# Patient Record
Sex: Male | Born: 1950 | Hispanic: Yes | Smoking: Former smoker
Health system: Southern US, Community
[De-identification: ages and names within clinical notes are randomized; demographics above are authoritative.]

## PROBLEM LIST (undated history)

## (undated) DIAGNOSIS — E785 Hyperlipidemia, unspecified: Secondary | ICD-10-CM

## (undated) DIAGNOSIS — I639 Cerebral infarction, unspecified: Secondary | ICD-10-CM

## (undated) DIAGNOSIS — F0390 Unspecified dementia without behavioral disturbance: Secondary | ICD-10-CM

## (undated) DIAGNOSIS — I6529 Occlusion and stenosis of unspecified carotid artery: Secondary | ICD-10-CM

## (undated) DIAGNOSIS — I10 Essential (primary) hypertension: Secondary | ICD-10-CM

## (undated) HISTORY — DX: Cerebral infarction, unspecified: I63.9

## (undated) HISTORY — DX: Essential (primary) hypertension: I10

## (undated) HISTORY — DX: Hyperlipidemia, unspecified: E78.5

## (undated) HISTORY — DX: Occlusion and stenosis of unspecified carotid artery: I65.29

## (undated) HISTORY — DX: Unspecified dementia, unspecified severity, without behavioral disturbance, psychotic disturbance, mood disturbance, and anxiety: F03.90

---

## 2011-12-18 ENCOUNTER — Inpatient Hospital Stay: Payer: Self-pay | Admitting: Internal Medicine

## 2011-12-18 LAB — URINALYSIS, COMPLETE
Bacteria: NONE SEEN
Bilirubin,UR: NEGATIVE
Glucose,UR: NEGATIVE mg/dL (ref 0–75)
Nitrite: NEGATIVE
Ph: 5 (ref 4.5–8.0)
Protein: NEGATIVE
RBC,UR: 1 /HPF (ref 0–5)
Squamous Epithelial: NONE SEEN
WBC UR: <1 /HPF (ref 0–5)

## 2011-12-18 LAB — CBC
HGB: 17 g/dL (ref 13.0–18.0)
MCV: 94 fL (ref 80–100)
Platelet: 250 10*3/uL (ref 150–440)
RBC: 5.23 10*6/uL (ref 4.40–5.90)
RDW: 13 % (ref 11.5–14.5)
WBC: 10.5 10*3/uL (ref 3.8–10.6)

## 2011-12-18 LAB — BASIC METABOLIC PANEL
Anion Gap: 8 (ref 7–16)
BUN: 11 mg/dL (ref 7–18)
Co2: 29 mmol/L (ref 21–32)
Creatinine: 0.62 mg/dL (ref 0.60–1.30)
EGFR (African American): >60
EGFR (Non-African Amer.): >60
Glucose: 94 mg/dL (ref 65–99)
Osmolality: 277 (ref 275–301)
Sodium: 139 mmol/L (ref 136–145)

## 2011-12-19 DIAGNOSIS — I059 Rheumatic mitral valve disease, unspecified: Secondary | ICD-10-CM

## 2011-12-19 LAB — BASIC METABOLIC PANEL
Calcium, Total: 9 mg/dL (ref 8.5–10.1)
Chloride: 104 mmol/L (ref 98–107)
Co2: 27 mmol/L (ref 21–32)
EGFR (Non-African Amer.): >60
Glucose: 99 mg/dL (ref 65–99)
Osmolality: 280 (ref 275–301)
Potassium: 3.5 mmol/L (ref 3.5–5.1)

## 2011-12-19 LAB — CBC WITH DIFFERENTIAL/PLATELET
Eosinophil: 22 %
HCT: 47.1 % (ref 40.0–52.0)
HGB: 16.4 g/dL (ref 13.0–18.0)
Lymphocytes: 31 %
Monocytes: 7 %
RBC: 5.01 10*6/uL (ref 4.40–5.90)
RDW: 13.3 % (ref 11.5–14.5)
WBC: 11.8 10*3/uL — ABNORMAL HIGH (ref 3.8–10.6)

## 2011-12-19 LAB — LIPID PANEL
Cholesterol: 237 mg/dL — ABNORMAL HIGH (ref 0–200)
Triglycerides: 290 mg/dL — ABNORMAL HIGH (ref 0–200)
VLDL Cholesterol, Calc: 58 mg/dL — ABNORMAL HIGH (ref 5–40)

## 2011-12-19 LAB — TSH: Thyroid Stimulating Horm: 7.1 u[IU]/mL — ABNORMAL HIGH

## 2011-12-19 LAB — T4, FREE: Free Thyroxine: 1.06 ng/dL (ref 0.76–1.46)

## 2011-12-19 LAB — HEMOGLOBIN A1C: Hemoglobin A1C: 6.2 % (ref 4.2–6.3)

## 2011-12-20 LAB — TSH: Thyroid Stimulating Horm: 3.77 u[IU]/mL

## 2011-12-26 ENCOUNTER — Ambulatory Visit: Payer: Self-pay | Admitting: Vascular Surgery

## 2011-12-26 LAB — CBC
HCT: 46.4 % (ref 40.0–52.0)
HGB: 16.4 g/dL (ref 13.0–18.0)
MCH: 33.1 pg (ref 26.0–34.0)
MCHC: 35.2 g/dL (ref 32.0–36.0)
WBC: 9 10*3/uL (ref 3.8–10.6)

## 2011-12-26 LAB — BASIC METABOLIC PANEL
BUN: 16 mg/dL (ref 7–18)
Calcium, Total: 9.4 mg/dL (ref 8.5–10.1)
Creatinine: 0.62 mg/dL (ref 0.60–1.30)
EGFR (African American): >60
EGFR (Non-African Amer.): >60
Glucose: 97 mg/dL (ref 65–99)
Potassium: 4.6 mmol/L (ref 3.5–5.1)
Sodium: 137 mmol/L (ref 136–145)

## 2012-01-02 ENCOUNTER — Inpatient Hospital Stay: Payer: Self-pay | Admitting: Vascular Surgery

## 2012-01-02 HISTORY — PX: CAROTID ENDARTERECTOMY: SUR193

## 2012-01-03 LAB — BASIC METABOLIC PANEL
Anion Gap: 7 (ref 7–16)
Calcium, Total: 8.5 mg/dL (ref 8.5–10.1)
EGFR (African American): >60
EGFR (Non-African Amer.): >60
Glucose: 114 mg/dL — ABNORMAL HIGH (ref 65–99)
Osmolality: 276 (ref 275–301)
Potassium: 3.6 mmol/L (ref 3.5–5.1)
Sodium: 139 mmol/L (ref 136–145)

## 2012-01-03 LAB — CBC WITH DIFFERENTIAL/PLATELET
Basophil #: 0.1 10*3/uL (ref 0.0–0.1)
Basophil %: 0.5 %
HCT: 39.8 % — ABNORMAL LOW (ref 40.0–52.0)
HGB: 13.7 g/dL (ref 13.0–18.0)
Lymphocyte %: 14.9 %
Monocyte %: 4.6 %
Neutrophil #: 8.9 10*3/uL — ABNORMAL HIGH (ref 1.4–6.5)
Platelet: 200 10*3/uL (ref 150–440)
RDW: 12.8 % (ref 11.5–14.5)
WBC: 12 10*3/uL — ABNORMAL HIGH (ref 3.8–10.6)

## 2012-01-03 LAB — PROTIME-INR: Prothrombin Time: 13.7 secs (ref 11.5–14.7)

## 2012-01-03 LAB — APTT: Activated PTT: 29.2 secs (ref 23.6–35.9)

## 2012-04-07 ENCOUNTER — Ambulatory Visit: Payer: Self-pay | Admitting: Neurology

## 2012-04-07 LAB — PROTIME-INR
INR: 1
Prothrombin Time: 13.9 secs (ref 11.5–14.7)

## 2012-04-07 LAB — APTT: Activated PTT: 30.3 secs (ref 23.6–35.9)

## 2012-04-07 LAB — PLATELET COUNT: Platelet: 242 10*3/uL (ref 150–440)

## 2012-04-09 ENCOUNTER — Ambulatory Visit: Payer: Self-pay | Admitting: Neurology

## 2012-04-10 ENCOUNTER — Ambulatory Visit: Payer: Self-pay | Admitting: Neurology

## 2013-11-02 DIAGNOSIS — Z125 Encounter for screening for malignant neoplasm of prostate: Secondary | ICD-10-CM | POA: Diagnosis not present

## 2013-11-02 DIAGNOSIS — E782 Mixed hyperlipidemia: Secondary | ICD-10-CM | POA: Diagnosis not present

## 2013-11-02 DIAGNOSIS — R7301 Impaired fasting glucose: Secondary | ICD-10-CM | POA: Diagnosis not present

## 2013-11-02 DIAGNOSIS — I63239 Cerebral infarction due to unspecified occlusion or stenosis of unspecified carotid arteries: Secondary | ICD-10-CM | POA: Diagnosis not present

## 2013-11-02 DIAGNOSIS — K469 Unspecified abdominal hernia without obstruction or gangrene: Secondary | ICD-10-CM | POA: Diagnosis not present

## 2013-11-02 DIAGNOSIS — I10 Essential (primary) hypertension: Secondary | ICD-10-CM | POA: Diagnosis not present

## 2013-11-02 DIAGNOSIS — R300 Dysuria: Secondary | ICD-10-CM | POA: Diagnosis not present

## 2013-11-02 DIAGNOSIS — I69959 Hemiplegia and hemiparesis following unspecified cerebrovascular disease affecting unspecified side: Secondary | ICD-10-CM | POA: Diagnosis not present

## 2014-05-03 DIAGNOSIS — E782 Mixed hyperlipidemia: Secondary | ICD-10-CM | POA: Diagnosis not present

## 2014-05-03 DIAGNOSIS — Z23 Encounter for immunization: Secondary | ICD-10-CM | POA: Diagnosis not present

## 2014-05-03 DIAGNOSIS — I6523 Occlusion and stenosis of bilateral carotid arteries: Secondary | ICD-10-CM | POA: Diagnosis not present

## 2014-05-03 DIAGNOSIS — I10 Essential (primary) hypertension: Secondary | ICD-10-CM | POA: Diagnosis not present

## 2014-05-03 DIAGNOSIS — B359 Dermatophytosis, unspecified: Secondary | ICD-10-CM | POA: Diagnosis not present

## 2014-05-03 DIAGNOSIS — R945 Abnormal results of liver function studies: Secondary | ICD-10-CM | POA: Diagnosis not present

## 2014-06-01 DIAGNOSIS — Z1211 Encounter for screening for malignant neoplasm of colon: Secondary | ICD-10-CM | POA: Diagnosis not present

## 2014-06-01 DIAGNOSIS — Z79899 Other long term (current) drug therapy: Secondary | ICD-10-CM | POA: Diagnosis not present

## 2014-06-21 DIAGNOSIS — E785 Hyperlipidemia, unspecified: Secondary | ICD-10-CM | POA: Diagnosis not present

## 2014-06-21 DIAGNOSIS — I6529 Occlusion and stenosis of unspecified carotid artery: Secondary | ICD-10-CM | POA: Diagnosis not present

## 2014-06-21 DIAGNOSIS — I639 Cerebral infarction, unspecified: Secondary | ICD-10-CM | POA: Diagnosis not present

## 2014-06-21 DIAGNOSIS — I63239 Cerebral infarction due to unspecified occlusion or stenosis of unspecified carotid arteries: Secondary | ICD-10-CM | POA: Diagnosis not present

## 2014-07-19 ENCOUNTER — Ambulatory Visit: Payer: Self-pay | Admitting: Gastroenterology

## 2014-07-19 DIAGNOSIS — Z1211 Encounter for screening for malignant neoplasm of colon: Secondary | ICD-10-CM | POA: Diagnosis not present

## 2014-07-19 DIAGNOSIS — K5730 Diverticulosis of large intestine without perforation or abscess without bleeding: Secondary | ICD-10-CM | POA: Diagnosis not present

## 2014-07-19 DIAGNOSIS — D122 Benign neoplasm of ascending colon: Secondary | ICD-10-CM | POA: Diagnosis not present

## 2014-07-19 DIAGNOSIS — K635 Polyp of colon: Secondary | ICD-10-CM | POA: Diagnosis not present

## 2014-07-19 DIAGNOSIS — Z7982 Long term (current) use of aspirin: Secondary | ICD-10-CM | POA: Diagnosis not present

## 2014-07-19 DIAGNOSIS — I10 Essential (primary) hypertension: Secondary | ICD-10-CM | POA: Diagnosis not present

## 2014-07-19 DIAGNOSIS — Z8673 Personal history of transient ischemic attack (TIA), and cerebral infarction without residual deficits: Secondary | ICD-10-CM | POA: Diagnosis not present

## 2014-09-06 NOTE — H&P (Signed)
PATIENT NAME:  Carlos Fischer, Carlos Fischer MR#:  [id number] DATE OF BIRTH:  [birth year]  DATE OF ADMISSION:  12/18/2011  PRIMARY CARE PHYSICIAN: [facility]. ED REFERRING PHYSICIAN: Dr. Robet Leu.   CHIEF COMPLAINT: Difficulty with vision. 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   HISTORY OF PRESENT ILLNESS: The patient is a 64 year old Spanish male Spanish speaking, had to use a translator to communicate with him, who presented to the ED after at work. He usually packs boxes and was noted to have problems with three boxes that were done improperly. The patient's initial was "something coming over my vision". He was brought to the Emergency Department. He actually was evaluated with initially CT scan of the head. Therefore underwent a MRI of the brain which confirmed a finding of acute nonhemorrhagic right frontal lobe infarct as well as multiple acute punctate infarcts involving the right frontal and parietal lobe suggesting a possible embolic phenomenon. Also, there was a small area of tiny foci of hemorrhage in the inferior right parietal lobe. We were asked to admit the patient. He currently states that his vision is back to normal. He could not describe what had happened. He stated that his eyes were not blurred. He did not have any double vision. It is just that when he was working he messed up a few boxes. Therefore, he was sent here. He does not complain of any other  weakness in his arms or legs. No difficulty with swallowing. No speech troubles. His denies any chest pains or palpitations.   PAST MEDICAL HISTORY: Significant for hypertension and hyperlipidemia which he reports has been diagnosed about a year ago and is untreated. He is not on any medications.   PAST SURGICAL HISTORY: None.   ALLERGIES: None.   MEDICATIONS: None.   SOCIAL HISTORY: Does not smoke. Drinks socially. No drug use.   FAMILY HISTORY: Father with prostate problem. Mother had some sort of  cancer. Denies any history of stroke or coronary artery disease in the family.   REVIEW OF SYSTEMS: CONSTITUTIONAL: Denies any fevers or chills. No weight loss. No weight gain. HEENT: Denies any pain in the eye. No redness. No double vision. Nose: Denies any nasal drainage or ulceration. No difficulty with swallowing. CARDIOVASCULAR: Denies any chest pain. No shortness of breath. No dyspnea on exertion. No palpitations. No syncope. PULMONARY: Denies any chronic obstructive pulmonary disease. No pneumonia. No tuberculosis. No asthma. No wheezing. GASTROINTESTINAL: Denies any nausea, vomiting, or diarrhea. No abdominal pain. No changes in bowel habits. No hematochezia. No hematemesis.  GU: Denies any dysuria, hematuria, renal calculus or frequency. SKIN: Denies any skin rash. HEME: Denies any anemia or easy bruisability. MUSCULOSKELETAL: Denies any pain in his joints or swelling. PSYCHIATRIC: Denies any anxiety or depression. NEUROLOGICAL: As above. He denies any asymmetrical weakness. No history of cerebrovascular accident. No seizures. No transient ischemic attacks in the past.   PHYSICAL EXAMINATION:  VITAL SIGNS: Temperature 97.8, pulse 71, respirations 20, blood pressure 145/87 and then most recent was 185/98.   GENERAL: The patient is a well-developed Spanish male not in any acute distress.   HEENT: Head atraumatic, normocephalic. Pupils equally round, reactive to light and accommodation. There is no conjunctival pallor. No scleral icterus. Extraocular movements intact. Nasal exam shows no drainage or ulceration. Oropharynx is clear.   NECK: No thyromegaly. No carotid bruits.   CARDIOVASCULAR: Regular rate and rhythm. No murmurs, rubs, clicks, or gallops. PMI nondisplaced.   LUNGS: Clear to  auscultation bilaterally without any rales, rhonchi, or wheezing.   ABDOMEN: Soft, nontender, nondistended. Positive bowel sounds x4. There is no hepatosplenomegaly.   EXTREMITIES: No clubbing, cyanosis,  or edema.   SKIN: No rash.   LYMPHATICS: No lymph nodes palpable.   VASCULAR: Good DP, PT pulses.   PSYCHIATRIC: Not anxious or depressed.   NEUROLOGICAL: Cranial nerves II through XII grossly intact. Visual confrontation is intact. Extraocular movements intact. Strength 5/5 in all four extremities. Reflexes 2+ and Babinski is downgoing.  LABORATORY, RADIOLOGICAL AND DIAGNOSTIC DATA: Evaluations so far in the ED showed a WBC 10.5, hemoglobin 17, platelet count 250. CT scan of the head showed findings consistent for age. Indeterminate cortical-based infarct right frontal lobe, right occipital lobe tiny foci of hyperattenuation in the superior right parietal lobe. His MRI of the brain showed acute nonhemorrhagic right frontal lobe infarct. Multiple acute punctate infarct involving the right frontal and parietal lobes suggesting embolic phenomenon. There are two hyperintense foci, one in the right parietal lobe, second in the inferior right parietal lobe concerning for tiny foci of hemorrhage.   ASSESSMENT AND PLAN: The patient is a 64 year old Spanish male who presents with vague visual difficulties. MRI of the brain shows multiple areas of cerebrovascular accident. 1. Acute ischemic cerebrovascular accident with a very tiny area of hemorrhage, likely conversion of ischemic stroke to small hemorrhage. At this time, will go ahead and give him low dose aspirin, Lipitor. He will need a neurology evaluation. Will do a follow-up CT scan of the head to make sure there is no expansion of the tiny area of hemorrhage. We will also get carotids and  echocardiogram. It is likely that he has atherosclerotic emboli from his carotids. If his echocardiogram is negative, he may need a TEE. Neurology evaluation as well.  2. Hypertension, accelerated. In light of the small hemorrhage areas, we will start him on a low-dose ACE inhibitor. For extremely high blood pressure, we will give him hydralazine.   3. Hyperlipidemia. We will start him on Lipitor. Check a fasting lipid panel in the a.m.  4. Miscellaneous. In light of hemorrhage, I will hold off on any anticoagulation for deep venous thrombosis. Will do lower extremity compression stockings.       TIME SPENT: 45 minutes.   ____________________________ Lafonda Mosses Posey Pronto, MD shp:ap D: 12/18/2011 19:56:52 ET T: 12/19/2011 07:38:31 ET JOB#: [id number]  cc: Reina Wilton H. Posey Pronto, MD, <Dictator> [facility] Jearld Lesch MD ELECTRONICALLY SIGNED 12/26/2011 13:38

## 2014-09-06 NOTE — Op Note (Signed)
PATIENT NAME:  Carlos Fischer, COKER MR#:  [id number] DATE OF BIRTH:  1951-03-01  DATE OF PROCEDURE:  01/02/2012  PREOPERATIVE DIAGNOSES:  1. 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  2. Hypertension.   POSTOPERATIVE DIAGNOSES:  1. 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  2. Hypertension.   PROCEDURE: Right carotid endarterectomy.   SURGEON: Algernon Huxley, MD  ANESTHESIA: General.  BLOOD LOSS: 100 mL.   INDICATION FOR PROCEDURE: 64 year old Hispanic male who is now about three weeks status post stroke where he was found to have a high-grade right carotid artery stenosis. This was allowed to heal for 2 to 3 weeks and he was brought back for endarterectomy for stroke risk reduction. It was discussed that his perioperative risk was increased over asymptomatic patients but his medical risk was also very high. He elected for surgery.   DESCRIPTION OF PROCEDURE: The patient was brought to the operative suite and after an adequate level of general anesthesia obtained, a roll was placed under the shoulder. His neck was flexed, turned to the side and he was placed in modified beach chair position. The neck and chest were sterilely prepped and draped, a sterile surgical field was created. An incision was then created along the anterior border of the sternocleidomastoid and we dissected down to the platysma with electrocautery. The sternocleidomastoid retracted laterally. A large venous branch was ligated and divided between silk ties and this exposed the facial vein which was ligated and divided between silk ties. There was a pretty high carotid bifurcation but we were able to dissect out and get above the lesion in the internal carotid artery. Encircled this with vessel loops as we did with the external carotid artery, common carotid artery and superior thyroid artery. The patient was systemically heparinized with 5000 units of intravenous heparin. This was  allowed to circulate for about five minutes. Control was then pulled up on the vessel loops. An anterior wall arteriotomy was created with an 11 blade and extended with Potts scissors. The Pruitt-Inah[place] shunt was placed in the usual fashion first in the internal carotid artery, flushed and de-aired then the common carotid artery flushed and de-aired and then control was returned. There was one minute between clamp and shunt restoration of flow. An endarterectomy was performed in the usual fashion with a proximal endpoint cut flush with tenotomy scissors with good inversion. Endarterectomy was performed on the external carotid artery and a nice feathered endpoint was created on the internal carotid artery. The distal endpoint was tacked down with two 7-0 Prolene sutures and all loose flecks were removed. Due to the large size of the artery I elected to close the artery primarily with running 6-0 Prolene. Started at the distal endpoint and run one-half the length of the arteriotomy. A second 6-0 Prolene was started at the proximal endpoint and run one-quarter the length of the arteriotomy. The Pruitt-[place] shunt was then removed. The vessel was flushed and de-aired from the external, internal and common carotid arteries and locally heparinized and then I completed the suture line flushing through the external carotid artery prior to release of control. There was approximately three minutes from shunt removal to restoration of flow. Two 6-0 Prolene patch sutures were used for hemostasis after restoration of flow and hemostasis was complete. The wound was irrigated. Surgicel and Evicyl were placed and the wound was closed with three interrupted 3-0 Vicryl sutures at the sternocleidomastoid. The platysma was closed with a running 3-0  Vicryl and the skin was closed with 4-0 Monocryl. Sterile dressing was placed. The patient tolerated procedure well and was taken to the recovery room in stable condition in stable  neurologic exam as compared to preop.   ____________________________ Algernon Huxley, MD jsd:cms D: 01/02/2012 15:09:12 ET T: 01/02/2012 15:51:58 ET JOB#: [id number]  cc: Algernon Huxley, MD, <Dictator>  Algernon Huxley MD ELECTRONICALLY SIGNED 01/02/2012 16:45

## 2014-09-06 NOTE — Discharge Summary (Signed)
PATIENT NAME:  Carlos Fischer, Carlos Fischer MR#:  [id number] DATE OF BIRTH:  17-May-1951  DATE OF ADMISSION:  12/18/2011 DATE OF DISCHARGE:  12/20/2011  ADMITTING DIAGNOSIS: Cerebrovascular accident.   DISCHARGE DIAGNOSES: 1. 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  2. Right internal carotid artery stenosis approximately 80% to 90% per magnetic resonance angiography as well as Doppler ultrasound. 3. Hypertension. 4. Hyperlipidemia.  5. Obesity. 6. Left leg skin lesion of unknown significance.   DISCHARGE CONDITION: Stable.   DISCHARGE MEDICATIONS: Patient is to start:  1. Aspirin 325 mg p.o. daily.  2. Lipitor 40 mg p.o. at bedtime.  3. Lisinopril 10 mg daily.   HOME OXYGEN: None.   DIET: 2 grams salt, low fat, low cholesterol. Patient was advised to lose weight if possible. Diet consistency-regular.   ACTIVITY LIMITATIONS: As tolerated.   FOLLOW UP: Follow-up appointment with Dr. Clayborn Bigness to establish primary care physician and be seen in approximately two days after discharge. Patient is to follow up with Dr. Lucky Cowboy. Patient will be scheduled for carotid endarterectomy on 01/02/2012.    CONSULTANTS:  1. Dr. Leotis Pain. 2. Dr. Valora Corporal. 3. Care management.   HISTORY OF PRESENT ILLNESS: Patient is a 64 year old Spanish male with no significant past medical history who presented to the hospital with complaints of left arm weakness as well as difficulty with vision. Please refer to Dr. Chana Bode Patels admission note on 12/18/2011. Apparently patient also was having some confusion as he was making mistakes at work. His arrival vitals: Temperature 97.8, pulse 71, respiratory rate 20, blood pressure 145/87 and as high as 185/98. Physical examination showed left upper extremity weakness.   LABORATORY, DIAGNOSTIC AND RADIOLOGICAL DATA: CT of head without contrast 12/18/2011 showed findings concerning for age indeterminate cortical base infarct in  the right frontal lobe as well as right occipital lobe. This may be acute or subacute. Further evaluation with MRI is recommended. Tiny focus of hyperattenuation in the superior right parietal lobe is felt to be artifactual or possibly related to a small focus of calcium, however, a very small focus of blood products will be difficult to exclude. Recommend attention on MRI. Follow up noncontrast head CT is recommended in 24 hours. 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 Repeated CT of head without contrast 12/19/2011 showed tiny foci of increased density seen previously in posterior parietal lobe on the right is somewhat less conspicuous today. The examination otherwise is not significantly changed according to radiologist. MRI of brain without contrast 12/18/2011 showed acute nonhemorrhagic right frontal lobe infarct, multiple acute punctate infarcts involving the right frontal and parietal lobes suggesting an embolic phenomena. These are two T1 hyperintense foci, one in right parietal lobe and the second in the inferior right parietal lobe concerning for tiny foci of hemorrhage. Recommend follow-up CT of the head in 24 hours according to radiologist. MRI of brain without contrast 12/18/2011: No evidence of intracranial aneurysm or high-grade stenotic lesion of the intracranial arterial circulation according to radiologist. MRA of neck carotids with contrast 12/19/2011 showed high grade stenotic lesion of proximal internal carotid artery on the right just distal to the bulb. The degree of stenosis is estimated at 80% to 90%. On the left there is no significant stenosis of the origin of the internal carotid artery nor at the bulb. There is mild stenosis of the origin of the left vertebral artery. Ultrasound of carotid arteries bilateral 12/20/2011 revealing findings concerning for high-grade stenosis in the right internal  carotid artery. This was measured at 89%. Peak systolic velocity ratios do not  reflect the significant stenosis. Vascular interventional surgical consultation and follow up is recommended according to radiologist. Echocardiogram done 12/19/2011: No source of cerebrovascular accident noted. No thrombus or vegetation seen. Otherwise, essentially normal study. Left ventricular function is normal. Ejection fraction equal or more than 55%. Transmitral spectral Doppler flow pattern is suggestive of impaired left ventricular relaxation. The right ventricular systolic function is normal. The left atrial size is normal. Right ventricular systolic pressure is normal.   Radiologist studies revealed embolic phenomenon in right parietal lobe in right hemisphere in general. Patient's lab data revealed normal BMP with CBC was also normal. His urinalysis was unremarkable. His EKG showed normal sinus rhythm at 67 beats per minute, normal axis, no acute ST-T changes were noted.   HOSPITAL COURSE: Patient was admitted to the hospital and because he had stroke, stroke workup was entertained. Patient underwent lipid panel evaluation and patient's lipid abnormalities were noted and discussed with patient. Patient's LDL was found to be high at 144. Patient's total cholesterol was 237, triglycerides were as well as high as 290 and HDL was 35. Patient was advised to use low fat, low cholesterol diet and lose weight if possible. Patients TSH was also checked and was found to be high at 7.1, however, patient's FT4 as well as free T3 were within normal limits so it was felt that patient's elevated TSH could have been just sick euthyroid syndrome. Vitamin B12 level was also checked and was found to be high 1164. CRP test was also performed which was normal at 0.57. HIV antibodies were also checked which were nonreactive. Patient remained on telemetry and his blood pressure was also followed and treated accordingly. Patient was recommended aspirin therapy at high dose of aspirin for at least a year and then possibly  cut down to 81 mg p.o. daily dose. Patient is to continue Lipitor and have his cholesterol level checked. As preferable LDL goal of below 100, preferably below 70. Patient's blood pressure also should be treated adequately and patient's medications were advanced to current levels to maintain his blood pressure in good control. On the day of discharge, 12/20/2011, patient's temperature 98.9, pulse 82, respiratory rate 20, blood pressure 113/68, saturation 94% to 97% on room air at rest. Patient is to continue Lipitor as well as lisinopril and aspirin indefinitely. Patient was evaluated by speech therapy, occupational therapy as well as physical therapy and they all felt that he is stable to be discharged home with no significant needs. Patient was followed by Dr. Valora Corporal, neurologist, who helped along. Because patient was found to have ICA stenosis Dr. Leotis Pain was also consulted. Dr. Lucky Cowboy saw patient in consultation on 12/20/2011 and looked through MR images as well as felt that the degree of stenosis is significant, 80% to 90%. Patient has high grade lesion that he can stroke and would benefit from endarterectomy for stroke risk reduction according to Dr. Lucky Cowboy. He felt that the patient should be allowed at least two weeks for healing prior to surgery to avoid reperfusion injury syndrome. Surgery is scheduled for 08/15 at noon. Risks as well as benefits of surgery versus medical management were discussed in detail with the help of interpreter. Risks of surgery is likely in the percent of 5 range given the high-grade stenosis and recent stroke but still much better than medical management alone according to Dr. Lucky Cowboy. Dr. Lucky Cowboy also recommended to continue aspirin therapy  and continue this up until surgery. Patient as well as family were in agreement with plan of care and made decision to follow up with Dr. Lucky Cowboy for surgery. Patient is being discharged in stable condition with above-mentioned medications and follow  up. He is to follow and establish primary care physician with Dr. Clayborn Bigness and be seen by Dr. Lucky Cowboy in recent future.   TIME SPENT: 40 minutes.  ____________________________ Theodoro Grist, MD rv:cms D: 12/20/2011 23:45:34 ET T: 12/21/2011 14:18:06 ET JOB#: [id number]  cc: Theodoro Grist, MD, <Dictator> Lavera Guise, MD  [place] MD ELECTRONICALLY SIGNED 12/23/2011 1:15

## 2014-09-06 NOTE — Consult Note (Signed)
64 yo HM with reported confusion and left arm weakness since Tuesday of this week.  History obtained via interpretor.  Found to have a small stroke on CT scan, and [place] and MRA done which suggest 80-90% right ICA stenosis. I have reviewed his MR images and agree with this degree of stenosis.  Has a high grade lesion with recent stroke, and would benefit from endarterectomy for stroke risk reduction.  Generally would allow  about 2 weeks for healing prior to surgery to avoid reperfusion injury/syndrome.  Surgery is scheduled for August 15 at noon.  Risks and benefits of surgery versus medical management were discussed in detail with help of interpretor.  Risk of surgery is likely in the 5% range given the high grade lesion and recent stroke, but still much better than medical management alone.  Should discharge on Aspirin therapy and continue this up until surgery.  He and his family are in agreement with our plan of care.    Electronic Signatures: Algernon Huxley (MD)  (Signed on 02-Aug-13 17:08)  Authored  Last Updated: 02-Aug-13 17:08 by Algernon Huxley (MD)

## 2014-09-06 NOTE — Consult Note (Signed)
Referring Physician:  Alric Seton   Primary Care Physician:  Alric Seton : [facility], [address], [place], [place] [address], [place] [phone]  Reason for Consult:  Admit Date: 18-Dec-2011   Chief Complaint: vision changes   Reason for Consult: CVA   History of Present Illness:  History of Present Illness:   64 yo RHD HM presents to [facility] after he had acute vision changes where everything appeared blurry yesterday while at work.  He reports mild weakness in the L arm that has been present for some time but does not notice any changes.  Vision changes resolved and he feels the exact same.  Pt denies ever having any headaches, weakness or numbness except as mentioned.  Limited history because pt speaks Spanish only but interpreter is at bedside.  ROS:   General weakness    HEENT no complaints    Lungs no complaints    Cardiac no complaints    GI no complaints    GU no complaints    Musculoskeletal no complaints    Extremities no complaints    Skin no complaints    Neuro no complaints    Endocrine no complaints    Psych no complaints   Past Medical/Surgical Hx:  hypercholesterolemia:   hypertension:   Past Medical/ Surgical Hx:   Past Medical History as above    Past Surgical History none   Allergies:  No Known Allergies:   Allergies:   Allergies NKDA   Social/Family History:  Employment Status: currently employed   Lives With: spouse   Living Arrangements: house   Social History: rare EtOH, no tob, no illicits, married   Family History: n/c   Vital Signs: **Vital Signs.:   01-Aug-13 12:08   Vital Signs Type Routine   Temperature Temperature (F) 98.7   Celsius 37   Temperature Source Oral   Pulse Pulse 80   Respirations Respirations 18   Systolic BP Systolic BP 163   Diastolic BP (mmHg) Diastolic BP (mmHg) 76   Mean BP 90   Pulse Ox % Pulse Ox % 94   Pulse Ox Activity Level  At rest   Oxygen Delivery  Room Air/ 21 %   Physical Exam:  General: NAD, nl weight, resting comfortably   HEENT: normocephalic, sclera nonicteric, oropharynx clear   Neck: supple, no JVD, no bruits   Chest: CTA B, no wheezes, good movement   Cardiac: RRR, no murmurs, no edema, 2+ pulses   Extremities: no C/C/E, FROM   Neurologic Exam:  Mental Status: alert and oriented x 3, normal speech and language, follows complex commands   Cranial Nerves: PERRLA, EOMI, nl VF, slight L facial droop, tongue midline, shoulder shrug equal   Motor Exam: 5/5 B except drift in L UE, nl tone, no tremor   Deep Tendon Reflexes: 2+/4 B, plantars downgoing B, no Hoffman   Sensory Exam: intact to pinprick, temperature, and vibration intact B   Coordination: FTN and HTS WNL, nl RAM   Lab Results: Thyroid:  01-Aug-13 04:06    Thyroid Stimulating Hormone  7.10 (0.45-4.50 (International Unit)  ----------------------- Pregnant patients have  different reference  ranges for TSH:  - - - - - - - - - -  Pregnant, first trimetser:  0.36 - 2.50 uIU/mL)  Routine Chem:  01-Aug-13 04:06    Hemoglobin A1c ([facility]) 6.2 (The American Diabetes Association recommends that a primary goal of therapy should be <7% and that physicians  should reevaluate the treatment regimen in patients with HbA1c values consistently >8%.)   Result Comment - manual diff performed to confirm  - eos>2  Result(s) reported on 19 Dec 2011 at 09:28AM.   Glucose, Serum 99   BUN 14   Creatinine (comp) 0.75   Sodium, Serum 140   Potassium, Serum 3.5   Chloride, Serum 104   CO2, Serum 27   Calcium (Total), Serum 9.0   Anion Gap 9   Osmolality (calc) 280   eGFR (African American) >60   eGFR (Non-African American) >60 (eGFR values <47mL/min/1.73 m2 may be an indication of chronic kidney disease (CKD). Calculated eGFR is useful in patients with stable renal function. The eGFR calculation will not be reliable in acutely ill patients when serum creatinine is changing  rapidly. It is not useful in  patients on dialysis. The eGFR calculation may not be applicable to patients at the low and high extremes of body sizes, pregnant women, and vegetarians.)   Cholesterol, Serum  237   Triglycerides, Serum  290   HDL (INHOUSE)  35   VLDL Cholesterol Calculated  58   LDL Cholesterol Calculated  144 (Result(s) reported on 19 Dec 2011 at 05:49AM.)  Routine UA:  31-Jul-13 16:51    Color (UA) Yellow   Clarity (UA) Hazy   Glucose (UA) Negative   Bilirubin (UA) Negative   Ketones (UA) Negative   Specific Gravity (UA) 1.018   Blood (UA) Negative   pH (UA) 5.0   Protein (UA) Negative   Nitrite (UA) Negative   Leukocyte Esterase (UA) Negative (Result(s) reported on 18 Dec 2011 at 06:09PM.)   RBC (UA) 1 /HPF   WBC (UA) <1 /HPF   Bacteria (UA) NONE SEEN   Epithelial Cells (UA) NONE SEEN   Mucous (UA) PRESENT (Result(s) reported on 18 Dec 2011 at 06:09PM.)  Routine Hem:  01-Aug-13 04:06    WBC (CBC)  11.8   RBC (CBC) 5.01   Hemoglobin (CBC) 16.4   Hematocrit (CBC) 47.1   Platelet Count (CBC) 259 (Result(s) reported on 19 Dec 2011 at 09:28AM.)   MCV 94   MCH 32.7   MCHC 34.8   RDW 13.3   Segmented Neutrophils 40   Lymphocytes 31   Monocytes 7   Eosinophil 22   Diff Comment 1 ANISOCYTOSIS   Radiology Results: MRI:    31-Jul-13 18:47, MRI Brain Without Contrast   MRI Brain Without Contrast    REASON FOR EXAM:    transient loss of inferior visual field  COMMENTS:       PROCEDURE: MR  - MR BRAIN WO CONTRAST  - Dec 18 2011  6:47PM     RESULT: History: Inferior visual loss    Technique: Multiplanar, multisequence MRI of the brain was obtained  without IV contrast.     Comparison:  None    Findings:     There is restricted diffusion in the right frontal lobe with     corresponding ADC abnormality consistent with an acute infarct. There are   punctate foci of strict the to diffusion in theright frontal and   parietal lobes consistent with areas  of acute infarction. Multiple acute   punctate infarcts involving the right frontal and parietal lobes   suggesting an embolic phenomenon. There are 2 T1 hyperintense foci, one   in the right parietal lobe and a second in the inferior right parietal   lobe concerning for tiny foci of hemorrhage. There is no pathologic  extra-axial fluid collection. There is generalized cerebral atrophy.   There is periventricular and deep white matter T2 and FLAIR   hyperintensity likely secondary to microangiopathy. There is no   hydrocephalus. The ventricles are normal for age. The basal cisterns are   patent. There are no abnormal extra-axial fluid collections.     There is right maxillary sinus mucosal thickening. The skull base and   calvarium demonstrate normal signal. The major intracranial flow voids,     including dural venous sinuses appear patent.    IMPRESSION:     1. Acute nonhemorrhagic right frontal lobe infarct.  2. Multiple acute punctate infarcts involving the right frontal and   parietal lobes suggesting an embolic phenomenon. There are 2 T1   hyperintense foci, one in the right parietal lobe and a second in the   inferior right parietal lobe concerning for tiny foci of hemorrhage.   Recommend followup CT of the head in 24 hours.    Dictation Site: 1          Verified By: Jennette Banker, M.D., MD  CT:    31-Jul-13 15:35, CT Head Without Contrast   CT Head Without Contrast    REASON FOR EXAM:    new onset vision change  COMMENTS:       PROCEDURE: CT  - CT HEAD WITHOUT CONTRAST  - Dec 18 2011  3:35PM     RESULT: Comparison:  None    Technique: Multiple axial images from the foramen magnum to the vertex   were obtained without IV contrast.    Findings:    Periventricular and subcortical hypoattenuation is likely sequela of   chronic microangiopathy. There is likely old small lacunar infarct in the   left caudate head. There is hypoattenuation extending to and involving    the gray matter of the right frontal lobe as well as the right occipital     lobe. This may represent age indeterminate infarcts, possibly acute or   subacute. Tiny focus of hyperattenuation in the right parietal lobe near   the vertex is felt to beartifactual related to partial volume average   artifact with the adjacent cortex. However, a tiny focus of blood   products would be difficult to exclude. This is seen on image 21.    No extra-axial fluid collection or midline shift seen. The ventricles are   normal in size. There is a small air fluid level in the right maxillary   sinus, incompletely visualized. Correlate for sinusitis.    IMPRESSION:   1. There are findings concerning for age indeterminate cortical-based   infarcts in the right frontal lobe and right occipital lobe. These may be   acute or subacute. Further evaluation with MRI is recommended.  2. Tiny focus of hyperattenuation in the superior right parietal lobe is     felt to be artifactual or possibly related to a small focus of calcium.   However, a very small focus of blood products would be difficult to   exclude. Recommend attention on the MRI. Followup noncontrast head CT is   recommended in 24 hours.  3. Findings of chronic microangiopathy.          Verified By: Gregor Hams, M.D., MD   Radiology Impression:  Radiology Impression: MRI personally reviewed by me and shows acute DWI changes in R ACA/MCA watershed area as well as some in the R MCA/PCA watershed areas.  There are defuse confluent white matter changes that respect the  board of the gray matter, these are much more prominent on the R compared to L.   Impression/Recommendations:  Recommendations:   labs noted to have high LDL d/w referring physician   Probable R hemispheric strokes-  although this is the most common cause of MRI changes, the fact that they are so diffuse and affect only the white matter is concerning for a leukoencephalopathy.   This MRI finding can be seen in PML, CNS lymphoma, variants of multiple sclerosis or CADASIL.   History supports none of these but pt's limited by language barrier even with an interpreter. Vision changes-  resolved and not sure exactly what this has to do with Hyperlipidemia-  mild  Hypertension-  there is no evidence of this  MRI w contrast of brain to further characterize these lesions MRA of head and neck to look for possible R carotid stenosis agree with ASA and statin echo pending check HIV, JC virus, ESR, CRP, B12, TSH will follow results you for this interesting consult  Electronic Signatures: Jamison Neighbor (MD)  (Signed 01-Aug-13 13:15)  Authored: REFERRING PHYSICIAN, Primary Care Physician, Consult, History of Present Illness, Review of Systems, PAST MEDICAL/SURGICAL HISTORY, ALLERGIES, Social/Family History, NURSING VITAL SIGNS, Physical Exam-, LAB RESULTS, RADIOLOGY RESULTS, Recommendations   Last Updated: 01-Aug-13 13:15 by Jamison Neighbor (MD)

## 2014-09-12 LAB — SURGICAL PATHOLOGY

## 2014-10-27 DIAGNOSIS — E782 Mixed hyperlipidemia: Secondary | ICD-10-CM | POA: Diagnosis not present

## 2014-10-27 DIAGNOSIS — I10 Essential (primary) hypertension: Secondary | ICD-10-CM | POA: Diagnosis not present

## 2014-11-10 DIAGNOSIS — I10 Essential (primary) hypertension: Secondary | ICD-10-CM | POA: Diagnosis not present

## 2014-11-10 DIAGNOSIS — I6523 Occlusion and stenosis of bilateral carotid arteries: Secondary | ICD-10-CM | POA: Diagnosis not present

## 2014-11-10 DIAGNOSIS — E782 Mixed hyperlipidemia: Secondary | ICD-10-CM | POA: Diagnosis not present

## 2014-11-10 DIAGNOSIS — I639 Cerebral infarction, unspecified: Secondary | ICD-10-CM | POA: Diagnosis not present

## 2014-12-20 DIAGNOSIS — I639 Cerebral infarction, unspecified: Secondary | ICD-10-CM | POA: Diagnosis not present

## 2014-12-20 DIAGNOSIS — E785 Hyperlipidemia, unspecified: Secondary | ICD-10-CM | POA: Diagnosis not present

## 2014-12-20 DIAGNOSIS — I6529 Occlusion and stenosis of unspecified carotid artery: Secondary | ICD-10-CM | POA: Diagnosis not present

## 2014-12-20 DIAGNOSIS — I63239 Cerebral infarction due to unspecified occlusion or stenosis of unspecified carotid arteries: Secondary | ICD-10-CM | POA: Diagnosis not present

## 2015-04-28 DIAGNOSIS — Z23 Encounter for immunization: Secondary | ICD-10-CM | POA: Diagnosis not present

## 2015-04-28 DIAGNOSIS — I6523 Occlusion and stenosis of bilateral carotid arteries: Secondary | ICD-10-CM | POA: Diagnosis not present

## 2015-04-28 DIAGNOSIS — I10 Essential (primary) hypertension: Secondary | ICD-10-CM | POA: Diagnosis not present

## 2015-04-28 DIAGNOSIS — I63139 Cerebral infarction due to embolism of unspecified carotid artery: Secondary | ICD-10-CM | POA: Diagnosis not present

## 2015-04-28 DIAGNOSIS — I69354 Hemiplegia and hemiparesis following cerebral infarction affecting left non-dominant side: Secondary | ICD-10-CM | POA: Diagnosis not present

## 2015-04-28 DIAGNOSIS — Z125 Encounter for screening for malignant neoplasm of prostate: Secondary | ICD-10-CM | POA: Diagnosis not present

## 2015-04-28 DIAGNOSIS — Z0001 Encounter for general adult medical examination with abnormal findings: Secondary | ICD-10-CM | POA: Diagnosis not present

## 2015-04-28 DIAGNOSIS — E782 Mixed hyperlipidemia: Secondary | ICD-10-CM | POA: Diagnosis not present

## 2015-12-22 DIAGNOSIS — I10 Essential (primary) hypertension: Secondary | ICD-10-CM | POA: Diagnosis not present

## 2015-12-22 DIAGNOSIS — I6523 Occlusion and stenosis of bilateral carotid arteries: Secondary | ICD-10-CM | POA: Diagnosis not present

## 2015-12-22 DIAGNOSIS — E785 Hyperlipidemia, unspecified: Secondary | ICD-10-CM | POA: Diagnosis not present

## 2015-12-22 DIAGNOSIS — I639 Cerebral infarction, unspecified: Secondary | ICD-10-CM | POA: Diagnosis not present

## 2015-12-22 DIAGNOSIS — I63239 Cerebral infarction due to unspecified occlusion or stenosis of unspecified carotid arteries: Secondary | ICD-10-CM | POA: Diagnosis not present

## 2016-01-18 DIAGNOSIS — I69354 Hemiplegia and hemiparesis following cerebral infarction affecting left non-dominant side: Secondary | ICD-10-CM | POA: Diagnosis not present

## 2016-01-18 DIAGNOSIS — I6523 Occlusion and stenosis of bilateral carotid arteries: Secondary | ICD-10-CM | POA: Diagnosis not present

## 2016-01-18 DIAGNOSIS — I10 Essential (primary) hypertension: Secondary | ICD-10-CM | POA: Diagnosis not present

## 2016-01-18 DIAGNOSIS — E782 Mixed hyperlipidemia: Secondary | ICD-10-CM | POA: Diagnosis not present

## 2016-01-18 DIAGNOSIS — R7301 Impaired fasting glucose: Secondary | ICD-10-CM | POA: Diagnosis not present

## 2016-02-05 DIAGNOSIS — Z23 Encounter for immunization: Secondary | ICD-10-CM | POA: Diagnosis not present

## 2016-08-15 DIAGNOSIS — Z0001 Encounter for general adult medical examination with abnormal findings: Secondary | ICD-10-CM | POA: Diagnosis not present

## 2016-08-15 DIAGNOSIS — E782 Mixed hyperlipidemia: Secondary | ICD-10-CM | POA: Diagnosis not present

## 2016-08-15 DIAGNOSIS — I10 Essential (primary) hypertension: Secondary | ICD-10-CM | POA: Diagnosis not present

## 2016-08-15 DIAGNOSIS — I6523 Occlusion and stenosis of bilateral carotid arteries: Secondary | ICD-10-CM | POA: Diagnosis not present

## 2016-10-10 DIAGNOSIS — I679 Cerebrovascular disease, unspecified: Secondary | ICD-10-CM | POA: Diagnosis not present

## 2016-10-10 DIAGNOSIS — E782 Mixed hyperlipidemia: Secondary | ICD-10-CM | POA: Diagnosis not present

## 2016-10-10 DIAGNOSIS — F411 Generalized anxiety disorder: Secondary | ICD-10-CM | POA: Diagnosis not present

## 2016-10-10 DIAGNOSIS — I10 Essential (primary) hypertension: Secondary | ICD-10-CM | POA: Diagnosis not present

## 2016-10-10 DIAGNOSIS — I69811 Memory deficit following other cerebrovascular disease: Secondary | ICD-10-CM | POA: Diagnosis not present

## 2016-12-23 ENCOUNTER — Encounter (INDEPENDENT_AMBULATORY_CARE_PROVIDER_SITE_OTHER): Payer: Self-pay

## 2016-12-24 ENCOUNTER — Encounter (INDEPENDENT_AMBULATORY_CARE_PROVIDER_SITE_OTHER): Payer: Self-pay

## 2016-12-24 ENCOUNTER — Ambulatory Visit (INDEPENDENT_AMBULATORY_CARE_PROVIDER_SITE_OTHER): Payer: Self-pay | Admitting: Vascular Surgery

## 2017-02-20 ENCOUNTER — Other Ambulatory Visit (INDEPENDENT_AMBULATORY_CARE_PROVIDER_SITE_OTHER): Payer: Self-pay | Admitting: Vascular Surgery

## 2017-02-20 DIAGNOSIS — I639 Cerebral infarction, unspecified: Secondary | ICD-10-CM

## 2017-02-21 ENCOUNTER — Ambulatory Visit (INDEPENDENT_AMBULATORY_CARE_PROVIDER_SITE_OTHER): Payer: Self-pay | Admitting: Vascular Surgery

## 2017-02-21 ENCOUNTER — Ambulatory Visit (INDEPENDENT_AMBULATORY_CARE_PROVIDER_SITE_OTHER): Payer: Medicare Other | Admitting: Vascular Surgery

## 2017-02-21 ENCOUNTER — Encounter (INDEPENDENT_AMBULATORY_CARE_PROVIDER_SITE_OTHER): Payer: Self-pay | Admitting: Vascular Surgery

## 2017-02-21 ENCOUNTER — Ambulatory Visit (INDEPENDENT_AMBULATORY_CARE_PROVIDER_SITE_OTHER): Payer: Medicare Other

## 2017-02-21 VITALS — BP 117/73 | HR 67 | Resp 16 | Ht 64.0 in | Wt 150.0 lb

## 2017-02-21 DIAGNOSIS — I10 Essential (primary) hypertension: Secondary | ICD-10-CM

## 2017-02-21 DIAGNOSIS — E785 Hyperlipidemia, unspecified: Secondary | ICD-10-CM

## 2017-02-21 DIAGNOSIS — I6523 Occlusion and stenosis of bilateral carotid arteries: Secondary | ICD-10-CM

## 2017-02-21 DIAGNOSIS — I639 Cerebral infarction, unspecified: Secondary | ICD-10-CM

## 2017-02-21 NOTE — Progress Notes (Signed)
Subjective:    Patient ID: Carlos Fischer, male    DOB: November 24, 1950, 66 y.o.   MRN: [id number] Chief Complaint  Patient presents with  . Follow-up    Carotid PT while in [place] had a cerebral hemmorage   Patient presents for a yearly carotid artery stenosis follow-up. Patient seen with son who was interpreter. Patient with recent cerebral hemorrhage while he was in [place]. Aspirin and Plavix were stopped. There does not seem to be any residual neurological effects. The stenosis has been followed by surveillance duplexes. The patient underwent a bilateral carotid duplex scan which showed no change from the previous exam on 12/20/11. Duplex is stable at patent right endarterectomy without evidence of restenosis and Left ICA stenosis (1-39%). The patient denies experiencing Amaurosis Fugax, TIA like symptoms or focal motor deficits.    Review of Systems  Constitutional: Negative.   HENT: Negative.   Eyes: Negative.   Respiratory: Negative.   Cardiovascular: Negative.   Gastrointestinal: Negative.   Endocrine: Negative.   Genitourinary: Negative.   Musculoskeletal: Negative.   Skin: Negative.   Allergic/Immunologic: Negative.   Neurological: Negative.   Hematological: Negative.   Psychiatric/Behavioral: Negative.       Objective:   Physical Exam  Constitutional: He is oriented to person, place, and time. He appears well-developed and well-nourished. No distress.  HENT:  Head: Normocephalic and atraumatic.  Neck: Normal range of motion.  No carotid bruits noted on exam  Cardiovascular: Normal rate, regular rhythm, normal heart sounds and intact distal pulses.   Pulses:      Radial pulses are 2+ on the right side, and 2+ on the left side.  Pulmonary/Chest: Effort normal.  Musculoskeletal: Normal range of motion. He exhibits no edema.  Neurological: He is alert and oriented to person, place, and time.  Skin: Skin is warm and dry. He is not diaphoretic.    Psychiatric: He has a normal mood and affect. His behavior is normal. Judgment and thought content normal.  Vitals reviewed.  BP 117/73   Pulse 67   Resp 16   Ht 5\' 4"  (1.626 m)   Wt 150 lb (68 kg)   BMI 25.75 kg/m   Past Medical History:  Diagnosis Date  . Carotid artery occlusion    Social History   Social History  . Marital status: Unknown    Spouse name: N/A  . Number of children: N/A  . Years of education: N/A   Occupational History  . Not on file.   Social History Main Topics  . Smoking status: Never Smoker  . Smokeless tobacco: Never Used  . Alcohol use Not on file  . Drug use: Unknown  . Sexual activity: Not on file   Other Topics Concern  . Not on file   Social History Narrative  . No narrative on file   History reviewed. No pertinent surgical history.  History reviewed. No pertinent family history.  No Known Allergies     Assessment & Plan:  Patient presents for a yearly carotid artery stenosis follow-up. Patient seen with son who was interpreter. Patient with recent cerebral hemorrhage while he was in [place]. Aspirin and Plavix were stopped. There does not seem to be any residual neurological effects. The stenosis has been followed by surveillance duplexes. The patient underwent a bilateral carotid duplex scan which showed no change from the previous exam on 12/20/11. Duplex is stable at patent right endarterectomy without evidence of restenosis and Left ICA stenosis (1-39%). The patient  denies experiencing Amaurosis Fugax, TIA like symptoms or focal motor deficits.   1. Bilateral carotid artery stenosis - Stable Studies reviewed with patient. Patient asymptomatic with stable duplex. No intervention at this time. Patient to return in one year for surveillance carotid duplex. Patient to remain abstinent of tobacco use. I have discussed with the patient at length the risk factors for and pathogenesis of atherosclerotic disease and encouraged a healthy  diet, regular exercise regimen and blood pressure / glucose control.  Patient was instructed to contact our office in the interim with problems such as arm / leg weakness or numbness, speech / swallowing difficulty or temporary monocular blindness. The patient expresses their understanding.  - VAS US CAROTID; Future  2. Hyperlipidemia, unspecified hyperlipidemia type - Stable Encouraged good control as its slows the progression of atherosclerotic disease  3. Essential hypertension - Stable Encouraged good control as its slows the progression of atherosclerotic disease  No current outpatient prescriptions on file prior to visit.   No current facility-administered medications on file prior to visit.    There are no Patient Instructions on file for this visit. No Follow-up on file.   Hallie Ishida A Kosha Jaquith, PA-C

## 2017-02-24 DIAGNOSIS — E781 Pure hyperglyceridemia: Secondary | ICD-10-CM | POA: Diagnosis not present

## 2017-02-24 DIAGNOSIS — I10 Essential (primary) hypertension: Secondary | ICD-10-CM | POA: Diagnosis not present

## 2017-02-24 DIAGNOSIS — Z0001 Encounter for general adult medical examination with abnormal findings: Secondary | ICD-10-CM | POA: Diagnosis not present

## 2017-02-24 DIAGNOSIS — Z125 Encounter for screening for malignant neoplasm of prostate: Secondary | ICD-10-CM | POA: Diagnosis not present

## 2017-03-04 ENCOUNTER — Other Ambulatory Visit: Payer: Self-pay | Admitting: Nurse Practitioner

## 2017-03-04 DIAGNOSIS — J309 Allergic rhinitis, unspecified: Secondary | ICD-10-CM | POA: Diagnosis not present

## 2017-03-04 DIAGNOSIS — I679 Cerebrovascular disease, unspecified: Secondary | ICD-10-CM | POA: Diagnosis not present

## 2017-03-04 DIAGNOSIS — I10 Essential (primary) hypertension: Secondary | ICD-10-CM | POA: Diagnosis not present

## 2017-03-04 DIAGNOSIS — R05 Cough: Secondary | ICD-10-CM | POA: Diagnosis not present

## 2017-03-04 DIAGNOSIS — E782 Mixed hyperlipidemia: Secondary | ICD-10-CM | POA: Diagnosis not present

## 2017-03-04 DIAGNOSIS — F411 Generalized anxiety disorder: Secondary | ICD-10-CM | POA: Diagnosis not present

## 2017-03-08 ENCOUNTER — Ambulatory Visit
Admission: RE | Admit: 2017-03-08 | Discharge: 2017-03-08 | Disposition: A | Discharge status: Home / Self Care | Payer: Medicare Other | Source: Ambulatory Visit | Attending: Nurse Practitioner | Admitting: Nurse Practitioner

## 2017-03-08 DIAGNOSIS — Z7902 Long term (current) use of antithrombotics/antiplatelets: Secondary | ICD-10-CM | POA: Diagnosis not present

## 2017-03-08 DIAGNOSIS — Z8673 Personal history of transient ischemic attack (TIA), and cerebral infarction without residual deficits: Secondary | ICD-10-CM | POA: Diagnosis not present

## 2017-03-08 DIAGNOSIS — I679 Cerebrovascular disease, unspecified: Secondary | ICD-10-CM | POA: Diagnosis not present

## 2017-03-08 DIAGNOSIS — I680 Cerebral amyloid angiopathy: Secondary | ICD-10-CM | POA: Diagnosis not present

## 2017-03-08 DIAGNOSIS — I639 Cerebral infarction, unspecified: Secondary | ICD-10-CM | POA: Diagnosis not present

## 2017-03-08 DIAGNOSIS — Z7982 Long term (current) use of aspirin: Secondary | ICD-10-CM | POA: Diagnosis not present

## 2017-03-08 DIAGNOSIS — R918 Other nonspecific abnormal finding of lung field: Secondary | ICD-10-CM | POA: Diagnosis not present

## 2017-03-08 DIAGNOSIS — E7800 Pure hypercholesterolemia, unspecified: Secondary | ICD-10-CM | POA: Diagnosis not present

## 2017-03-08 DIAGNOSIS — M6281 Muscle weakness (generalized): Secondary | ICD-10-CM | POA: Diagnosis not present

## 2017-03-08 DIAGNOSIS — E854 Organ-limited amyloidosis: Secondary | ICD-10-CM | POA: Diagnosis not present

## 2017-03-08 DIAGNOSIS — Z79899 Other long term (current) drug therapy: Secondary | ICD-10-CM | POA: Diagnosis not present

## 2017-03-08 DIAGNOSIS — I10 Essential (primary) hypertension: Secondary | ICD-10-CM | POA: Diagnosis not present

## 2017-03-10 ENCOUNTER — Other Ambulatory Visit: Payer: Self-pay | Admitting: Nurse Practitioner

## 2017-03-10 ENCOUNTER — Ambulatory Visit
Admission: RE | Admit: 2017-03-10 | Discharge: 2017-03-10 | Disposition: A | Discharge status: Home / Self Care | Payer: Self-pay | Source: Ambulatory Visit | Attending: Nurse Practitioner | Admitting: Nurse Practitioner

## 2017-03-10 DIAGNOSIS — I679 Cerebrovascular disease, unspecified: Secondary | ICD-10-CM

## 2017-05-07 DIAGNOSIS — E854 Organ-limited amyloidosis: Secondary | ICD-10-CM | POA: Diagnosis not present

## 2017-05-07 DIAGNOSIS — I639 Cerebral infarction, unspecified: Secondary | ICD-10-CM | POA: Diagnosis not present

## 2017-05-07 DIAGNOSIS — F0151 Vascular dementia with behavioral disturbance: Secondary | ICD-10-CM | POA: Diagnosis not present

## 2017-05-07 DIAGNOSIS — I629 Nontraumatic intracranial hemorrhage, unspecified: Secondary | ICD-10-CM | POA: Diagnosis not present

## 2017-05-07 DIAGNOSIS — I680 Cerebral amyloid angiopathy: Secondary | ICD-10-CM | POA: Diagnosis not present

## 2017-07-10 ENCOUNTER — Encounter: Payer: Self-pay | Admitting: Nurse Practitioner

## 2017-07-10 ENCOUNTER — Ambulatory Visit (INDEPENDENT_AMBULATORY_CARE_PROVIDER_SITE_OTHER): Payer: Medicare Other | Admitting: Nurse Practitioner

## 2017-07-10 VITALS — BP 133/84 | HR 69 | Resp 16 | Ht 64.0 in | Wt 155.0 lb

## 2017-07-10 DIAGNOSIS — I6523 Occlusion and stenosis of bilateral carotid arteries: Secondary | ICD-10-CM

## 2017-07-10 DIAGNOSIS — R7301 Impaired fasting glucose: Secondary | ICD-10-CM | POA: Diagnosis not present

## 2017-07-10 DIAGNOSIS — I10 Essential (primary) hypertension: Secondary | ICD-10-CM

## 2017-07-10 DIAGNOSIS — I679 Cerebrovascular disease, unspecified: Secondary | ICD-10-CM

## 2017-07-10 DIAGNOSIS — Z0001 Encounter for general adult medical examination with abnormal findings: Secondary | ICD-10-CM | POA: Diagnosis not present

## 2017-07-10 LAB — POCT GLYCOSYLATED HEMOGLOBIN (HGB A1C): Hemoglobin A1C: 5.7

## 2017-07-10 NOTE — Progress Notes (Signed)
[facility] [place], [place] [address]  Internal MEDICINE  Office Visit Note  Patient Name: Carlos Fischer  [id number]  [id number]  Date of Service: 07/11/2017  Chief Complaint  Patient presents with  . Hypertension     Hypertension  This is a chronic problem. The current episode started more than 1 year ago. The problem is unchanged. The problem is controlled. Pertinent negatives include no chest pain, neck pain, palpitations or shortness of breath. There are no associated agents to hypertension. Risk factors for coronary artery disease include dyslipidemia and male gender. Past treatments include ACE inhibitors and beta blockers. The current treatment provides moderate improvement. There are no compliance problems.  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   Pt is here for routine health maintenance examination  Current Medication: Outpatient Encounter Medications as of 07/10/2017  Medication Sig  . aspirin EC 81 MG tablet Take 81 mg by mouth daily.  Marland Kitchen atorvastatin (LIPITOR) 40 MG tablet Take 40 mg by mouth daily.  Marland Kitchen lisinopril (PRINIVIL,ZESTRIL) 10 MG tablet Take 10 mg by mouth daily.  Marland Kitchen LORazepam (ATIVAN) 0.5 MG tablet Take 0.5 mg by mouth.  . metoprolol succinate (TOPROL-XL) 25 MG 24 hr tablet Take 25 mg by mouth daily.  . [DISCONTINUED] clopidogrel (PLAVIX) 75 MG tablet Take 75 mg by mouth daily.   No facility-administered encounter medications on file as of 07/10/2017.     Surgical History: Past Surgical History:  Procedure Laterality Date  . CAROTID ENDARTERECTOMY  01/02/2012    Medical History: Past Medical History:  Diagnosis Date  . Carotid artery occlusion   . Carotid stenosis   . Hyperlipidemia   . Hypertension     Family History: No family history on file.    Review of Systems  Constitutional: Negative for activity change, chills, fatigue and unexpected weight change.  HENT: Negative for  congestion, postnasal drip, rhinorrhea, sneezing and sore throat.   Eyes: Negative.  Negative for redness.  Respiratory: Negative for cough, chest tightness and shortness of breath.   Cardiovascular: Negative for chest pain and palpitations.  Gastrointestinal: Negative for abdominal pain, constipation, diarrhea, nausea and vomiting.  Endocrine: Negative for cold intolerance, heat intolerance, polydipsia, polyphagia and polyuria.  Genitourinary: Negative for dysuria and frequency.  Musculoskeletal: Negative for arthralgias, back pain, joint swelling and neck pain.  Skin: Negative for rash.  Allergic/Immunologic: Negative for environmental allergies.  Neurological: Positive for tremors and weakness. Negative for numbness.  Hematological: Negative for adenopathy. Does not bruise/bleed easily.  Psychiatric/Behavioral: Negative for behavioral problems (Depression), sleep disturbance and suicidal ideas. The patient is not nervous/anxious.      Today's Vitals   07/10/17 0944  BP: 133/84  Pulse: 69  Resp: 16  SpO2: 95%  Weight: 155 lb (70.3 kg)  Height: 5\' 4"  (1.626 m)   Physical Exam  Constitutional: He is oriented to person, place, and time. He appears well-developed and well-nourished. No distress.  HENT:  Head: Normocephalic and atraumatic.  Mouth/Throat: Oropharynx is clear and moist. No oropharyngeal exudate.  Eyes: EOM are normal. Pupils are equal, round, and reactive to light.  Neck: Normal range of motion. Neck supple. No JVD present. Carotid bruit is not present. No tracheal deviation present. No thyromegaly present.  Cardiovascular: Normal rate, regular rhythm, normal heart sounds and intact distal pulses. Exam reveals no gallop and no friction rub.  No murmur heard. Pulmonary/Chest: Effort normal and breath sounds normal. No respiratory distress. He has no wheezes. He has  no rales. He exhibits no tenderness.  Abdominal: Soft. Bowel sounds are normal. There is no tenderness.   Musculoskeletal: Normal range of motion.  Lymphadenopathy:    He has no cervical adenopathy.  Neurological: He is alert and oriented to person, place, and time. No cranial nerve deficit.  The patient has mild to moderate upper and lowe extremity weakness on the left side. Continues to do exercises to strengthen the extremities. He is otherwise, at his neurological baseline.   Skin: Skin is warm and dry. He is not diaphoretic.  Psychiatric: He has a normal mood and affect. His behavior is normal. Judgment and thought content normal.  Nursing note and vitals reviewed.   Assessment/Plan: 1. Encounter for general adult medical examination with abnormal findings Annual wellness visit today.   2. Impaired fasting glucose - POCT HgB A1C 5.7 today. Slight increase I liklihood to develop diabetes in the future. Will monitor closely.   3. Essential hypertension Stable. Continue bp medication as prescribed   4. Cerebrovascular disease, unspecified Patient now on aspirin only. Will continue to be followed by neurology.   5. Bilateral carotid artery stenosis Continue to monitor.   General Counseling: [name] verbalizes understanding of the findings of todays visit and agrees with plan of treatment. I have discussed any further diagnostic evaluation that may be needed or ordered today. We also reviewed his medications today. he has been encouraged to call the office with any questions or concerns that should arise related to todays visit.   This patient was seen by Leretha Pol, FNP- C in Collaboration with Dr Lavera Guise as a part of collaborative care agreement    Orders Placed This Encounter  Procedures  . POCT HgB A1C     Time spent: Posey, MD  Internal Medicine

## 2017-07-11 NOTE — Addendum Note (Signed)
Addended by: Leretha Pol on: 07/11/2017 08:52 AM   Modules accepted: Level of Service

## 2017-08-05 ENCOUNTER — Other Ambulatory Visit: Payer: Self-pay

## 2017-08-05 MED ORDER — ATORVASTATIN CALCIUM 40 MG PO TABS: 40.0000 mg | ORAL_TABLET | Freq: Every day | ORAL | 3 refills | Status: DC

## 2017-08-05 MED ORDER — LISINOPRIL 10 MG PO TABS: 10.0000 mg | ORAL_TABLET | Freq: Every day | ORAL | 3 refills | Status: DC

## 2017-08-06 ENCOUNTER — Other Ambulatory Visit: Payer: Self-pay

## 2017-08-06 MED ORDER — METOPROLOL SUCCINATE ER 25 MG PO TB24: 25.0000 mg | ORAL_TABLET | Freq: Every day | ORAL | 2 refills | Status: DC

## 2018-01-08 ENCOUNTER — Ambulatory Visit: Payer: Self-pay | Admitting: Adult Health

## 2018-01-14 ENCOUNTER — Encounter: Payer: Self-pay | Admitting: Adult Health

## 2018-01-14 ENCOUNTER — Ambulatory Visit (INDEPENDENT_AMBULATORY_CARE_PROVIDER_SITE_OTHER): Payer: Medicare Other | Admitting: Adult Health

## 2018-01-14 VITALS — BP 131/81 | HR 68 | Resp 16 | Ht 64.0 in | Wt 149.4 lb

## 2018-01-14 DIAGNOSIS — I10 Essential (primary) hypertension: Secondary | ICD-10-CM | POA: Diagnosis not present

## 2018-01-14 DIAGNOSIS — I6523 Occlusion and stenosis of bilateral carotid arteries: Secondary | ICD-10-CM | POA: Diagnosis not present

## 2018-01-14 DIAGNOSIS — F0150 Vascular dementia without behavioral disturbance: Secondary | ICD-10-CM

## 2018-01-14 NOTE — Patient Instructions (Signed)

## 2018-01-14 NOTE — Progress Notes (Signed)
[facility] [place], [place] [address]  Internal MEDICINE  Office Visit Note  Patient Name: Carlos Fischer  [id number]  [id number]  Date of Service: 02/01/2018  Chief Complaint  Patient presents with  . Hypertension  . Memory Loss    HPI Pt here for 6 mont follow up. His son is present in exam room.  Son reports the patient has a lot of issues with his memory. States he will get up to go to the bathroom and can not find it, and doesn't remember where it is.  They are suppose to see a neurologist however, they still have not been able to make an appt.  He is able to take his medicine with no difficulty, his wife is there to remind him to take said medicine.  He denies complaints. Pt is alert and oriented x 2 in office. Son reports this is baseline.   Current Medication: Outpatient Encounter Medications as of 01/14/2018  Medication Sig  . aspirin EC 81 MG tablet Take 81 mg by mouth daily.  Marland Kitchen atorvastatin (LIPITOR) 40 MG tablet Take 1 tablet (40 mg total) by mouth daily.  Marland Kitchen lisinopril (PRINIVIL,ZESTRIL) 10 MG tablet Take 1 tablet (10 mg total) by mouth daily.  . metoprolol succinate (TOPROL-XL) 25 MG 24 hr tablet Take 1 tablet (25 mg total) by mouth daily.  Marland Kitchen LORazepam (ATIVAN) 0.5 MG tablet Take 0.5 mg by mouth.   No facility-administered encounter medications on file as of 01/14/2018.     Surgical History: Past Surgical History:  Procedure Laterality Date  . CAROTID ENDARTERECTOMY  01/02/2012    Medical History: Past Medical History:  Diagnosis Date  . Carotid artery occlusion   . Carotid stenosis   . Hyperlipidemia   . Hypertension     Family History: History reviewed. No pertinent family history.  Social History   Socioeconomic History  . Marital status: Unknown    Spouse name: Not on file  . Number of children: Not on file  . Years of education: Not on file  . Highest education level: Not on file  Occupational  History  . Not on file  Social Needs  . Financial resource strain: Not on file  . Food insecurity:    Worry: Not on file    Inability: Not on file  . Transportation needs:    Medical: Not on file    Non-medical: Not on file  Tobacco Use  . Smoking status: Former Research scientist (life sciences)  . Smokeless tobacco: Never Used  Substance and Sexual Activity  . Alcohol use: No    Frequency: Never  . Drug use: No  . Sexual activity: Not on file  Lifestyle  . Physical activity:    Days per week: Not on file    Minutes per session: Not on file  . Stress: Not on file  Relationships  . Social connections:    Talks on phone: Not on file    Gets together: Not on file    Attends religious service: Not on file    Active member of club or organization: Not on file    Attends meetings of clubs or organizations: Not on file    Relationship status: Not on file  . Intimate partner violence:    Fear of current or ex partner: Not on file    Emotionally abused: Not on file    Physically abused: Not on file    Forced sexual activity: Not on file  Other Topics Concern  .  Not on file  Social History Narrative  . Not on file      Review of Systems  Constitutional: Negative.  Negative for chills, fatigue and unexpected weight change.  HENT: Negative.  Negative for congestion, rhinorrhea, sneezing and sore throat.   Eyes: Negative for redness.  Respiratory: Negative.  Negative for cough, chest tightness and shortness of breath.   Cardiovascular: Negative.  Negative for chest pain and palpitations.  Gastrointestinal: Negative.  Negative for abdominal pain, constipation, diarrhea, nausea and vomiting.  Endocrine: Negative.   Genitourinary: Negative.  Negative for dysuria and frequency.  Musculoskeletal: Negative.  Negative for arthralgias, back pain, joint swelling and neck pain.  Skin: Negative.  Negative for rash.  Allergic/Immunologic: Negative.   Neurological: Negative.  Negative for tremors and numbness.   Hematological: Negative for adenopathy. Does not bruise/bleed easily.  Psychiatric/Behavioral: Negative.  Negative for behavioral problems, sleep disturbance and suicidal ideas. The patient is not nervous/anxious.     Vital Signs: BP 131/81 (BP Location: Right Arm, Patient Position: Sitting, Cuff Size: Large)   Pulse 68   Resp 16   Ht 5\' 4"  (1.626 m)   Wt 149 lb 6.4 oz (67.8 kg)   SpO2 97%   BMI 25.64 kg/m    Physical Exam  Constitutional: He is oriented to person, place, and time. He appears well-developed and well-nourished. No distress.  HENT:  Head: Normocephalic and atraumatic.  Mouth/Throat: Oropharynx is clear and moist. No oropharyngeal exudate.  Eyes: Pupils are equal, round, and reactive to light. EOM are normal.  Neck: Normal range of motion. Neck supple. No JVD present. No tracheal deviation present. No thyromegaly present.  Cardiovascular: Normal rate, regular rhythm and normal heart sounds. Exam reveals no gallop and no friction rub.  No murmur heard. Pulmonary/Chest: Effort normal and breath sounds normal. No respiratory distress. He has no wheezes. He has no rales. He exhibits no tenderness.  Abdominal: Soft. There is no tenderness. There is no guarding.  Musculoskeletal: Normal range of motion.  Lymphadenopathy:    He has no cervical adenopathy.  Neurological: He is alert and oriented to person, place, and time. No cranial nerve deficit.  Skin: Skin is warm and dry. He is not diaphoretic.  Psychiatric: He has a normal mood and affect. His behavior is normal. Judgment and thought content normal.  Nursing note and vitals reviewed.  Assessment/Plan: 1. Cerebrovascular disease, unspecified/ Vascular dementia?? Family reports never seeing neurology.  Will refer again.  Pts memory is declining.  Getting lost at home, and unable to remember how to get to restroom at home etc.  - Ambulatory referral to Neurology  2. Essential hypertension Controlled on current  medications.  Continue as directed.   3. Bilateral carotid artery stenosis Pt sees [facility] on 02/24/2018 for follow up.     General Counseling: Carlos Fischer verbalizes understanding of the findings of todays visit and agrees with plan of treatment. I have discussed any further diagnostic evaluation that may be needed or ordered today. We also reviewed his medications today. he has been encouraged to call the office with any questions or concerns that should arise related to todays visit.    Orders Placed This Encounter  Procedures  . Ambulatory referral to Neurology    Time spent: 25  Minutes  This patient was seen by Orson Gear AGNP-C in Collaboration with Dr Lavera Guise as a part of collaborative care agreement    Dr Lavera Guise Internal medicine

## 2018-02-24 ENCOUNTER — Encounter (INDEPENDENT_AMBULATORY_CARE_PROVIDER_SITE_OTHER): Payer: Self-pay | Admitting: Vascular Surgery

## 2018-02-24 ENCOUNTER — Ambulatory Visit (INDEPENDENT_AMBULATORY_CARE_PROVIDER_SITE_OTHER): Payer: Medicare Other | Admitting: Vascular Surgery

## 2018-02-24 ENCOUNTER — Ambulatory Visit (INDEPENDENT_AMBULATORY_CARE_PROVIDER_SITE_OTHER): Payer: Medicare Other

## 2018-02-24 VITALS — BP 144/87 | HR 71 | Resp 16 | Ht 64.0 in | Wt 150.8 lb

## 2018-02-24 DIAGNOSIS — I10 Essential (primary) hypertension: Secondary | ICD-10-CM | POA: Diagnosis not present

## 2018-02-24 DIAGNOSIS — I6523 Occlusion and stenosis of bilateral carotid arteries: Secondary | ICD-10-CM | POA: Diagnosis not present

## 2018-02-24 DIAGNOSIS — E785 Hyperlipidemia, unspecified: Secondary | ICD-10-CM

## 2018-02-24 NOTE — Assessment & Plan Note (Signed)
Carotid duplex today reveals stable 1 to 39% left ICA stenosis and a widely patent right carotid endarterectomy. Continue current medical regimen including aspirin and Lipitor.  Recheck in 1 year.

## 2018-02-24 NOTE — Patient Instructions (Signed)
Carotid Artery Disease The carotid arteries are arteries on both sides of the neck. They carry blood to the brain. Carotid artery disease is when the arteries get smaller (narrow) or get blocked. If these arteries get smaller or get blocked, you are more likely to have a stroke or warning stroke (transient ischemic attack). Follow these instructions at home:  Take medicines as told by your doctor. Make sure you understand all your medicine instructions. Do not stop your medicines without talking to your doctor first.  Follow your doctor's diet instructions. It is important to eat a healthy diet that includes plenty of: ? Fresh fruits. ? Vegetables. ? Lean meats.  Avoid: ? High-fat foods. ? High-sodium foods. ? Foods that are fried, overly processed, or have poor nutritional value.  Stay a healthy weight.  Stay active. Get at least 30 minutes of activity every day.  Do not smoke.  Limit alcohol use to: ? No more than 2 drinks a day for men. ? No more than 1 drink a day for women who are not pregnant.  Do not use illegal drugs.  Keep all doctor visits as told. Get help right away if:  You have sudden weakness or loss of feeling (numbness) on one side of the body, such as the face, arm, or leg.  You have sudden confusion.  You have trouble speaking (aphasia) or understanding.  You have sudden trouble seeing out of one or both eyes.  You have sudden trouble walking.  You have dizziness or feel like you might pass out (faint).  You have a loss of balance or your movements are not steady (uncoordinated).  You have a sudden, severe headache with no known cause.  You have trouble swallowing (dysphagia). Call your local emergency services (911 in [place] Do notdrive yourself to the clinic or hospital. This information is not intended to replace advice given to you by your health care provider. Make sure you discuss any questions you have with your health care  provider. Document Released: 04/22/2012 Document Revised: 10/12/2015 Document Reviewed: 11/04/2012 Elsevier Interactive Patient Education  2018 Elsevier Inc.  

## 2018-02-24 NOTE — Assessment & Plan Note (Signed)
lipid control important in reducing the progression of atherosclerotic disease. Continue statin therapy  

## 2018-02-24 NOTE — Progress Notes (Signed)
MRN : [id number]  Carlos Fischer is a 67 y.o. (May 26, 1950) male who presents with chief complaint of  Chief Complaint  Patient presents with  . Carotid    93yr follow up  .  History of Present Illness: Patient returns in follow-up of his carotid disease.  He is about 6 years status post right carotid endarterectomy.  He is doing well today.  No new issues or problems.  No focal neurologic symptoms. Specifically, the patient denies amaurosis fugax, speech or swallowing difficulties, or arm or leg weakness or numbness.  Carotid duplex today reveals stable 1 to 39% left ICA stenosis and a widely patent right carotid endarterectomy.  Current Outpatient Medications  Medication Sig Dispense Refill  . aspirin EC 81 MG tablet Take 81 mg by mouth daily.    Marland Kitchen atorvastatin (LIPITOR) 40 MG tablet Take 1 tablet (40 mg total) by mouth daily. 90 tablet 3  . lisinopril (PRINIVIL,ZESTRIL) 10 MG tablet Take 1 tablet (10 mg total) by mouth daily. 90 tablet 3  . metoprolol succinate (TOPROL-XL) 25 MG 24 hr tablet Take 1 tablet (25 mg total) by mouth daily. 90 tablet 2  . LORazepam (ATIVAN) 0.5 MG tablet Take 0.5 mg by mouth.     No current facility-administered medications for this visit.     Past Medical History:  Diagnosis Date  . Carotid artery occlusion   . Carotid stenosis   . Hyperlipidemia   . Hypertension     Past Surgical History:  Procedure Laterality Date  . CAROTID ENDARTERECTOMY  01/02/2012    Social History Social History   Tobacco Use  . Smoking status: Former Research scientist (life sciences)  . Smokeless tobacco: Never Used  Substance Use Topics  . Alcohol use: No    Frequency: Never  . Drug use: No     Family History No bleeding or clotting disorders  No Known Allergies   REVIEW OF SYSTEMS (Negative unless checked)  Constitutional: [] Weight loss  [] Fever  [] Chills Cardiac: [] Chest pain   [] Chest pressure   [] Palpitations   [] Shortness of breath when laying flat    [] Shortness of breath at rest   [] Shortness of breath with exertion. Vascular:  [] Pain in legs with walking   [] Pain in legs at rest   [] Pain in legs when laying flat   [] Claudication   [] Pain in feet when walking  [] Pain in feet at rest  [] Pain in feet when laying flat   [] History of DVT   [] Phlebitis   [] Swelling in legs   [] Varicose veins   [] Non-healing ulcers Pulmonary:   [] Uses home oxygen   [] Productive cough   [] Hemoptysis   [] Wheeze  [] COPD   [] Asthma Neurologic:  [] Dizziness  [] Blackouts   [] Seizures   [] History of stroke   [] History of TIA  [] Aphasia   [] Temporary blindness   [] Dysphagia   [] Weakness or numbness in arms   [] Weakness or numbness in legs Musculoskeletal:  [x] Arthritis   [] Joint swelling   [] Joint pain   [] Low back pain Hematologic:  [] Easy bruising  [] Easy bleeding   [] Hypercoagulable state   [] Anemic  [] Hepatitis Gastrointestinal:  [] Blood in stool   [] Vomiting blood  [x] Gastroesophageal reflux/heartburn   [] Difficulty swallowing. Genitourinary:  [] Chronic kidney disease   [] Difficult urination  [] Frequent urination  [] Burning with urination   [] Blood in urine Skin:  [] Rashes   [] Ulcers   [] Wounds Psychological:  [] History of anxiety   []  History of major depression.  Physical Examination  Vitals:  02/24/18 0910 02/24/18 0911  BP: (!) 153/83 (!) 144/87  Pulse: 71   Resp: 16   Weight: 150 lb 12.8 oz (68.4 kg)   Height: 5\' 4"  (1.626 m)    Body mass index is 25.88 kg/m. Gen:  WD/WN, NAD Head: [place]/AT, No temporalis wasting. Ear/Nose/Throat: Hearing grossly intact, nares w/o erythema or drainage, trachea midline Eyes: Conjunctiva clear. Sclera non-icteric Neck: Supple.  No bruit  Pulmonary:  Good air movement, equal and clear to auscultation bilaterally.  Cardiac: RRR, No JVD Vascular:  Vessel Right Left  Radial Palpable Palpable                           Musculoskeletal: M/S 5/5 throughout.  No deformity or atrophy.  Neurologic: CN 2-12 intact.  Sensation grossly intact in extremities.  Symmetrical.  Speech is fluent. Motor exam as listed above. Psychiatric: Judgment intact, Mood & affect appropriate for pt's clinical situation. Dermatologic: No rashes or ulcers noted.  No cellulitis or open wounds.      CBC Lab Results  Component Value Date   WBC 12.0 (H) 01/03/2012   HGB 13.7 01/03/2012   HCT 39.8 (L) 01/03/2012   MCV 95 01/03/2012   PLT 242 04/07/2012    BMET    Component Value Date/Time   NA 139 01/03/2012 0604   K 3.6 01/03/2012 0604   CL 106 01/03/2012 0604   CO2 26 01/03/2012 0604   GLUCOSE 114 (H) 01/03/2012 0604   BUN 5 (L) 01/03/2012 0604   CREATININE 0.61 01/03/2012 0604   CALCIUM 8.5 01/03/2012 0604   GFRNONAA >60 01/03/2012 0604   GFRAA >60 01/03/2012 0604   CrCl cannot be calculated (Patient's most recent lab result is older than the maximum 21 days allowed.).  COAG Lab Results  Component Value Date   INR 1.0 04/07/2012   INR 1.0 01/03/2012    Radiology No results found.   Assessment/Plan Hyperlipidemia lipid control important in reducing the progression of atherosclerotic disease. Continue statin therapy   Essential hypertension blood pressure control important in reducing the progression of atherosclerotic disease. On appropriate oral medications.   Bilateral carotid artery stenosis Carotid duplex today reveals stable 1 to 39% left ICA stenosis and a widely patent right carotid endarterectomy. Continue current medical regimen including aspirin and Lipitor.  Recheck in 1 year.    Leotis Pain, MD  02/24/2018 9:35 AM    This note was created with Dragon medical transcription system.  Any errors from dictation are purely unintentional

## 2018-02-24 NOTE — Assessment & Plan Note (Signed)
blood pressure control important in reducing the progression of atherosclerotic disease. On appropriate oral medications.  

## 2018-02-25 DIAGNOSIS — Z8673 Personal history of transient ischemic attack (TIA), and cerebral infarction without residual deficits: Secondary | ICD-10-CM | POA: Diagnosis not present

## 2018-02-25 DIAGNOSIS — R413 Other amnesia: Secondary | ICD-10-CM | POA: Diagnosis not present

## 2018-02-26 ENCOUNTER — Other Ambulatory Visit: Payer: Self-pay | Admitting: Acute Care

## 2018-02-26 DIAGNOSIS — Z8673 Personal history of transient ischemic attack (TIA), and cerebral infarction without residual deficits: Secondary | ICD-10-CM

## 2018-03-08 ENCOUNTER — Ambulatory Visit
Admission: RE | Admit: 2018-03-08 | Discharge: 2018-03-08 | Disposition: A | Discharge status: Home / Self Care | Payer: Medicare Other | Source: Ambulatory Visit | Attending: Acute Care | Admitting: Acute Care

## 2018-03-08 DIAGNOSIS — R413 Other amnesia: Secondary | ICD-10-CM | POA: Diagnosis not present

## 2018-03-08 DIAGNOSIS — Z8673 Personal history of transient ischemic attack (TIA), and cerebral infarction without residual deficits: Secondary | ICD-10-CM | POA: Diagnosis not present

## 2018-03-08 DIAGNOSIS — I611 Nontraumatic intracerebral hemorrhage in hemisphere, cortical: Secondary | ICD-10-CM | POA: Diagnosis not present

## 2018-03-08 DIAGNOSIS — G9389 Other specified disorders of brain: Secondary | ICD-10-CM | POA: Insufficient documentation

## 2018-03-08 DIAGNOSIS — I679 Cerebrovascular disease, unspecified: Secondary | ICD-10-CM | POA: Diagnosis not present

## 2018-03-11 ENCOUNTER — Other Ambulatory Visit: Payer: Self-pay

## 2018-03-11 MED ORDER — METOPROLOL SUCCINATE ER 25 MG PO TB24: 25.0000 mg | ORAL_TABLET | Freq: Every day | ORAL | 2 refills | Status: DC

## 2018-03-20 DIAGNOSIS — Z8673 Personal history of transient ischemic attack (TIA), and cerebral infarction without residual deficits: Secondary | ICD-10-CM | POA: Diagnosis not present

## 2018-03-20 DIAGNOSIS — F0151 Vascular dementia with behavioral disturbance: Secondary | ICD-10-CM | POA: Diagnosis not present

## 2018-03-20 DIAGNOSIS — I619 Nontraumatic intracerebral hemorrhage, unspecified: Secondary | ICD-10-CM | POA: Diagnosis not present

## 2018-03-25 ENCOUNTER — Ambulatory Visit (INDEPENDENT_AMBULATORY_CARE_PROVIDER_SITE_OTHER): Payer: Medicare Other | Admitting: Adult Health

## 2018-03-25 ENCOUNTER — Encounter: Payer: Self-pay | Admitting: Adult Health

## 2018-03-25 VITALS — BP 104/72 | HR 70 | Temp 98.4°F | Resp 16 | Ht 62.0 in | Wt 150.0 lb

## 2018-03-25 DIAGNOSIS — I10 Essential (primary) hypertension: Secondary | ICD-10-CM

## 2018-03-25 DIAGNOSIS — I679 Cerebrovascular disease, unspecified: Secondary | ICD-10-CM | POA: Diagnosis not present

## 2018-03-25 DIAGNOSIS — F0150 Vascular dementia without behavioral disturbance: Secondary | ICD-10-CM

## 2018-03-25 DIAGNOSIS — E785 Hyperlipidemia, unspecified: Secondary | ICD-10-CM | POA: Diagnosis not present

## 2018-03-25 DIAGNOSIS — Z23 Encounter for immunization: Secondary | ICD-10-CM | POA: Diagnosis not present

## 2018-03-25 NOTE — Progress Notes (Signed)
[facility] [place], [place] [address]  Internal MEDICINE  Office Visit Note  Patient Name: Carlos Fischer  [id number]  [id number]  Date of Service: 03/25/2018  Chief Complaint  Patient presents with  . Hemorrhagic stroke    needs ct per neurologist     HPI  Pt is here requesting Ct. He has a complicated history of multiple hemorrhagic strokes.  Per his son, he had a large stroke in [place] in July 2018.  He has since seen [facility] as well as [facility] to evaluate his ongoing bleeding on his brain.  They have requested for the patient to stop his Plavix and have another CT scan done in one month, and then follow up to see how the bleeds are progressing.       Current Medication: Outpatient Encounter Medications as of 03/25/2018  Medication Sig  . aspirin EC 81 MG tablet Take 81 mg by mouth daily.  Marland Kitchen atorvastatin (LIPITOR) 40 MG tablet Take 1 tablet (40 mg total) by mouth daily.  Marland Kitchen lisinopril (PRINIVIL,ZESTRIL) 10 MG tablet Take 1 tablet (10 mg total) by mouth daily.  Marland Kitchen LORazepam (ATIVAN) 0.5 MG tablet Take 0.5 mg by mouth.  . metoprolol succinate (TOPROL-XL) 25 MG 24 hr tablet Take 1 tablet (25 mg total) by mouth daily.   No facility-administered encounter medications on file as of 03/25/2018.     Surgical History: Past Surgical History:  Procedure Laterality Date  . CAROTID ENDARTERECTOMY  01/02/2012    Medical History: Past Medical History:  Diagnosis Date  . Carotid artery occlusion   . Carotid stenosis   . Hyperlipidemia   . Hypertension   . Stroke [facility]    Hemorrhagic stroke    Family History: Family History  Family history unknown: Yes    Social History   Socioeconomic History  . Marital status: Unknown    Spouse name: Not on file  . Number of children: Not on file  . Years of education: Not on file  . Highest education level: Not on file  Occupational History  . Not on file  Social Needs  .  Financial resource strain: Not on file  . Food insecurity:    Worry: Not on file    Inability: Not on file  . Transportation needs:    Medical: Not on file    Non-medical: Not on file  Tobacco Use  . Smoking status: Former Research scientist (life sciences)  . Smokeless tobacco: Never Used  Substance and Sexual Activity  . Alcohol use: No    Frequency: Never  . Drug use: No  . Sexual activity: Not on file  Lifestyle  . Physical activity:    Days per week: Not on file    Minutes per session: Not on file  . Stress: Not on file  Relationships  . Social connections:    Talks on phone: Not on file    Gets together: Not on file    Attends religious service: Not on file    Active member of club or organization: Not on file    Attends meetings of clubs or organizations: Not on file    Relationship status: Not on file  . Intimate partner violence:    Fear of current or ex partner: Not on file    Emotionally abused: Not on file    Physically abused: Not on file    Forced sexual activity: Not on file  Other Topics Concern  . Not on file  Social History Narrative  . Not on file      Review of Systems  Constitutional: Negative.  Negative for chills, fatigue and unexpected weight change.  HENT: Negative.  Negative for congestion, rhinorrhea, sneezing and sore throat.   Eyes: Negative for redness.  Respiratory: Negative.  Negative for cough, chest tightness and shortness of breath.   Cardiovascular: Negative.  Negative for chest pain and palpitations.  Gastrointestinal: Negative.  Negative for abdominal pain, constipation, diarrhea, nausea and vomiting.  Endocrine: Negative.   Genitourinary: Negative.  Negative for dysuria and frequency.  Musculoskeletal: Negative.  Negative for arthralgias, back pain, joint swelling and neck pain.  Skin: Negative.  Negative for rash.  Allergic/Immunologic: Negative.   Neurological: Positive for weakness. Negative for tremors and numbness.       Memory loss Left sided  weakness from CVA 2013  Hematological: Negative for adenopathy. Does not bruise/bleed easily.  Psychiatric/Behavioral: Negative.  Negative for behavioral problems, sleep disturbance and suicidal ideas. The patient is not nervous/anxious.     Vital Signs: BP 104/72 (BP Location: Left Arm, Patient Position: Sitting, Cuff Size: Normal)   Pulse 70   Temp 98.4 F (36.9 C) (Oral)   Resp 16   Ht 5\' 2"  (1.575 m)   Wt 150 lb (68 kg)   SpO2 96%   BMI 27.44 kg/m    Physical Exam  Constitutional: He appears well-developed and well-nourished. No distress.  HENT:  Head: Normocephalic and atraumatic.  Mouth/Throat: Oropharynx is clear and moist. No oropharyngeal exudate.  Eyes: Pupils are equal, round, and reactive to light. EOM are normal.  Neck: Normal range of motion. Neck supple. No JVD present. No tracheal deviation present. No thyromegaly present.  Cardiovascular: Normal rate, regular rhythm and normal heart sounds. Exam reveals no gallop and no friction rub.  No murmur heard. Pulmonary/Chest: Effort normal and breath sounds normal. No respiratory distress. He has no wheezes. He has no rales. He exhibits no tenderness.  Abdominal: Soft. There is no tenderness. There is no guarding.  Musculoskeletal:  Decreased ROM on left side.    Lymphadenopathy:    He has no cervical adenopathy.  Neurological: He is alert. No cranial nerve deficit.  Pt has dx of vascular dementia.   Skin: Skin is warm and dry. He is not diaphoretic.  Psychiatric: He has a normal mood and affect. His behavior is normal. Judgment and thought content normal.  Nursing note and vitals reviewed.  Assessment/Plan: 1. Cerebrovascular disease, unspecified Per neurology's request head CT ordered for approximately 1 month from now patient's son is aware they will need to request a disc to take to neurology at [facility]. - CT Head Wo Contrast; Future  2. Vascular dementia, uncomplicated [facility]) Patient son reports he was told by  neurology that the patient is presenting with some vascular dementia.  They will continue to monitor this in the future. - CT Head Wo Contrast; Future  3. Essential hypertension Blood pressures well controlled at this time.  Continue all medications as prescribed currently.  4. Hyperlipidemia, unspecified hyperlipidemia type Patient's recent lipid panel shows good control of hyperlipidemia.  General Counseling: Carlos Fischer verbalizes understanding of the findings of todays visit and agrees with plan of treatment. I have discussed any further diagnostic evaluation that may be needed or ordered today. We also reviewed his medications today. he has been encouraged to call the office with any questions or concerns that should arise related to todays visit.    Orders Placed This Encounter  Procedures  .  CT Head Wo Contrast    No orders of the defined types were placed in this encounter.   Time spent: 25  Minutes   This patient was seen by Orson Gear AGNP-C in Collaboration with Dr Lavera Guise as a part of collaborative care agreement     Kendell Bane AGNP-C Internal medicine

## 2018-04-29 ENCOUNTER — Ambulatory Visit: Payer: Medicare Other

## 2018-05-28 ENCOUNTER — Other Ambulatory Visit: Payer: Self-pay

## 2018-05-28 ENCOUNTER — Ambulatory Visit
Admission: RE | Admit: 2018-05-28 | Discharge: 2018-05-28 | Disposition: A | Discharge status: Home / Self Care | Payer: Medicare Other | Source: Ambulatory Visit | Attending: Adult Health | Admitting: Adult Health

## 2018-05-28 DIAGNOSIS — I679 Cerebrovascular disease, unspecified: Secondary | ICD-10-CM

## 2018-05-28 DIAGNOSIS — F0150 Vascular dementia without behavioral disturbance: Secondary | ICD-10-CM | POA: Insufficient documentation

## 2018-05-28 DIAGNOSIS — G9389 Other specified disorders of brain: Secondary | ICD-10-CM | POA: Diagnosis not present

## 2018-05-28 DIAGNOSIS — H539 Unspecified visual disturbance: Secondary | ICD-10-CM | POA: Diagnosis not present

## 2018-05-28 MED ORDER — ATORVASTATIN CALCIUM 40 MG PO TABS: 40.0000 mg | ORAL_TABLET | Freq: Every day | ORAL | 3 refills | Status: DC

## 2018-05-28 MED ORDER — LISINOPRIL 10 MG PO TABS: 10.0000 mg | ORAL_TABLET | Freq: Every day | ORAL | 3 refills | Status: DC

## 2018-06-05 DIAGNOSIS — F0391 Unspecified dementia with behavioral disturbance: Secondary | ICD-10-CM | POA: Diagnosis not present

## 2018-06-05 DIAGNOSIS — E854 Organ-limited amyloidosis: Secondary | ICD-10-CM | POA: Diagnosis not present

## 2018-06-05 DIAGNOSIS — F0150 Vascular dementia without behavioral disturbance: Secondary | ICD-10-CM | POA: Diagnosis not present

## 2018-06-05 DIAGNOSIS — Z79899 Other long term (current) drug therapy: Secondary | ICD-10-CM | POA: Diagnosis not present

## 2018-06-05 DIAGNOSIS — E7800 Pure hypercholesterolemia, unspecified: Secondary | ICD-10-CM | POA: Diagnosis not present

## 2018-06-05 DIAGNOSIS — I680 Cerebral amyloid angiopathy: Secondary | ICD-10-CM | POA: Diagnosis not present

## 2018-06-05 DIAGNOSIS — Z87891 Personal history of nicotine dependence: Secondary | ICD-10-CM | POA: Diagnosis not present

## 2018-06-05 DIAGNOSIS — I10 Essential (primary) hypertension: Secondary | ICD-10-CM | POA: Diagnosis not present

## 2018-06-05 DIAGNOSIS — Z7982 Long term (current) use of aspirin: Secondary | ICD-10-CM | POA: Diagnosis not present

## 2018-06-05 DIAGNOSIS — Z8673 Personal history of transient ischemic attack (TIA), and cerebral infarction without residual deficits: Secondary | ICD-10-CM | POA: Diagnosis not present

## 2018-07-21 ENCOUNTER — Encounter: Payer: Self-pay | Admitting: Adult Health

## 2018-07-21 ENCOUNTER — Ambulatory Visit (INDEPENDENT_AMBULATORY_CARE_PROVIDER_SITE_OTHER): Payer: Medicare Other | Admitting: Adult Health

## 2018-07-21 VITALS — BP 118/76 | HR 65 | Resp 16 | Ht 62.0 in | Wt 152.0 lb

## 2018-07-21 DIAGNOSIS — I10 Essential (primary) hypertension: Secondary | ICD-10-CM

## 2018-07-21 DIAGNOSIS — E785 Hyperlipidemia, unspecified: Secondary | ICD-10-CM | POA: Diagnosis not present

## 2018-07-21 DIAGNOSIS — I679 Cerebrovascular disease, unspecified: Secondary | ICD-10-CM

## 2018-07-21 DIAGNOSIS — F0150 Vascular dementia without behavioral disturbance: Secondary | ICD-10-CM | POA: Diagnosis not present

## 2018-07-21 NOTE — Progress Notes (Signed)
[facility] [place], [place] [address]  Internal MEDICINE  Office Visit Note  Patient Name: Carlos Fischer  [id number]  [id number]  Date of Service: 07/21/2018  Chief Complaint  Patient presents with  . Dementia  . Hypertension  . Hyperlipidemia    HPI  Pt is here for follow up on dementia, htn, and hld. His son is in the exam room with him.  He reports the patient has seen neurology since his CT was done on Jan 9th.  Neurology has placed the patient on Namenda twice a day.  His son reports he is doing much better since starting the medication.  He is less agitated and does not get upset or annoyed with situations like he did before.   Overall, he is pleased with the results of the medication.     Current Medication: Outpatient Encounter Medications as of 07/21/2018  Medication Sig  . aspirin EC 81 MG tablet Take 81 mg by mouth daily.  Marland Kitchen atorvastatin (LIPITOR) 40 MG tablet Take 1 tablet (40 mg total) by mouth daily.  Marland Kitchen lisinopril (PRINIVIL,ZESTRIL) 10 MG tablet Take 1 tablet (10 mg total) by mouth daily.  . memantine (NAMENDA) 5 MG tablet Take 5 mg by mouth 2 (two) times daily.  . metoprolol succinate (TOPROL-XL) 25 MG 24 hr tablet Take 1 tablet (25 mg total) by mouth daily.  Marland Kitchen LORazepam (ATIVAN) 0.5 MG tablet Take 0.5 mg by mouth.   No facility-administered encounter medications on file as of 07/21/2018.     Surgical History: Past Surgical History:  Procedure Laterality Date  . CAROTID ENDARTERECTOMY  01/02/2012    Medical History: Past Medical History:  Diagnosis Date  . Carotid artery occlusion   . Carotid stenosis   . Hyperlipidemia   . Hypertension   . Stroke [facility])    Hemorrhagic stroke    Family History: Family History  Family history unknown: Yes    Social History   Socioeconomic History  . Marital status: Unknown    Spouse name: Not on file  . Number of children: Not on file  . Years of education: Not on  file  . Highest education level: Not on file  Occupational History  . Not on file  Social Needs  . Financial resource strain: Not on file  . Food insecurity:    Worry: Not on file    Inability: Not on file  . Transportation needs:    Medical: Not on file    Non-medical: Not on file  Tobacco Use  . Smoking status: Former Research scientist (life sciences)  . Smokeless tobacco: Never Used  Substance and Sexual Activity  . Alcohol use: No    Frequency: Never  . Drug use: No  . Sexual activity: Not on file  Lifestyle  . Physical activity:    Days per week: Not on file    Minutes per session: Not on file  . Stress: Not on file  Relationships  . Social connections:    Talks on phone: Not on file    Gets together: Not on file    Attends religious service: Not on file    Active member of club or organization: Not on file    Attends meetings of clubs or organizations: Not on file    Relationship status: Not on file  . Intimate partner violence:    Fear of current or ex partner: Not on file    Emotionally abused: Not on file    Physically abused:  Not on file    Forced sexual activity: Not on file  Other Topics Concern  . Not on file  Social History Narrative  . Not on file      Review of Systems  Constitutional: Negative.  Negative for chills, fatigue and unexpected weight change.  HENT: Negative.  Negative for congestion, rhinorrhea, sneezing and sore throat.   Eyes: Negative for redness.  Respiratory: Negative.  Negative for cough, chest tightness and shortness of breath.   Cardiovascular: Negative.  Negative for chest pain and palpitations.  Gastrointestinal: Negative.  Negative for abdominal pain, constipation, diarrhea, nausea and vomiting.  Endocrine: Negative.   Genitourinary: Negative.  Negative for dysuria and frequency.  Musculoskeletal: Negative.  Negative for arthralgias, back pain, joint swelling and neck pain.  Skin: Negative.  Negative for rash.  Allergic/Immunologic: Negative.    Neurological: Negative.  Negative for tremors and numbness.  Hematological: Negative for adenopathy. Does not bruise/bleed easily.  Psychiatric/Behavioral: Negative.  Negative for behavioral problems, sleep disturbance and suicidal ideas. The patient is not nervous/anxious.     Vital Signs: BP 118/76   Pulse 65   Resp 16   Ht 5\' 2"  (1.575 m)   Wt 152 lb (68.9 kg)   SpO2 96%   BMI 27.80 kg/m    Physical Exam Vitals signs and nursing note reviewed.  Constitutional:      General: He is not in acute distress.    Appearance: He is well-developed. He is not diaphoretic.  HENT:     Head: Normocephalic and atraumatic.     Mouth/Throat:     Pharynx: No oropharyngeal exudate.  Eyes:     Pupils: Pupils are equal, round, and reactive to light.  Neck:     Musculoskeletal: Normal range of motion and neck supple.     Thyroid: No thyromegaly.     Vascular: No JVD.     Trachea: No tracheal deviation.  Cardiovascular:     Rate and Rhythm: Normal rate and regular rhythm.     Heart sounds: Normal heart sounds. No murmur. No friction rub. No gallop.   Pulmonary:     Effort: Pulmonary effort is normal. No respiratory distress.     Breath sounds: Normal breath sounds. No wheezing or rales.  Chest:     Chest wall: No tenderness.  Abdominal:     Palpations: Abdomen is soft.     Tenderness: There is no abdominal tenderness. There is no guarding.  Musculoskeletal: Normal range of motion.  Lymphadenopathy:     Cervical: No cervical adenopathy.  Skin:    General: Skin is warm and dry.  Neurological:     Mental Status: He is alert and oriented to person, place, and time.     Cranial Nerves: No cranial nerve deficit.  Psychiatric:        Behavior: Behavior normal.        Thought Content: Thought content normal.        Judgment: Judgment normal.    Assessment/Plan: 1. Vascular dementia, uncomplicated ([place]) Pt recently placed on Namenda and doing well per family.  Continue current dosing,  and follow up with neurology as scheduled.   2. Cerebrovascular disease, unspecified Stable per CT. Continue to follow up with neurology.   3. Essential hypertension Stable, continue with present therapy.  4. Hyperlipidemia, unspecified hyperlipidemia type Stable, well controlled.  Continue current medications.  General Counseling: [name] verbalizes understanding of the findings of todays visit and agrees with plan of treatment. I have discussed  any further diagnostic evaluation that may be needed or ordered today. We also reviewed his medications today. he has been encouraged to call the office with any questions or concerns that should arise related to todays visit.    No orders of the defined types were placed in this encounter.   No orders of the defined types were placed in this encounter.   Time spent: 25 Minutes   This patient was seen by Orson Gear AGNP-C in Collaboration with Dr Lavera Guise as a part of collaborative care agreement     Kendell Bane AGNP-C Internal medicine

## 2018-07-21 NOTE — Patient Instructions (Signed)
Dementia Caregiver Guide Dementia is a term used to describe a number of symptoms that affect memory and thinking. The most common symptoms include:  Memory loss.  Trouble with language and communication.  Trouble concentrating.  Poor judgment.  Problems with reasoning.  Child-like behavior and language.  Extreme anxiety.  Angry outbursts.  Wandering from home or public places. Dementia usually gets worse slowly over time. In the early stages, people with dementia can stay independent and safe with some help. In later stages, they need help with daily tasks such as dressing, grooming, and using the bathroom. How to help the person with dementia cope Dementia can be frightening and confusing. Here are some tips to help the person with dementia cope with changes caused by the disease. General tips  Keep the person on track with his or her routine.  Try to identify areas where the person may need help.  Be supportive, patient, calm, and encouraging.  Gently remind the person that adjusting to changes takes time.  Help with the tasks that the person has asked for help with.  Keep the person involved in daily tasks and decisions as much as possible.  Encourage conversation, but try not to get frustrated or harried if the person struggles to find words or does not seem to appreciate your help. Communication tips  When the person is talking or seems frustrated, make eye contact and hold the person's hand.  Ask specific questions that need yes or no answers.  Use simple words, short sentences, and a calm voice. Only give one direction at a time.  When offering choices, limit them to just 1 or 2.  Avoid correcting the person in a negative way.  If the person is struggling to find the right words, gently try to help him or her. How to recognize symptoms of stress Symptoms of stress in caregivers include:  Feeling frustrated or angry with the person with dementia.   Denying that the person has dementia or that his or her symptoms will not improve.  Feeling hopeless and unappreciated.  Difficulty sleeping.  Difficulty concentrating.  Feeling anxious, irritable, or depressed.  Developing stress-related health problems.  Feeling like you have too little time for your own life. Follow these instructions at home:   Make sure that you and the person you are caring for: ? Get regular sleep. ? Exercise regularly. ? Eat regular, nutritious meals. ? Drink enough fluid to keep your urine clear or pale yellow. ? Take over-the-counter and prescription medicines only as told by your health care providers. ? Attend all scheduled health care appointments.  Join a support group with others who are caregivers.  Ask about respite care resources so that you can have a regular break from the stress of caregiving.  Look for signs of stress in yourself and in the person you are caring for. If you notice signs of stress, take steps to manage it.  Consider any safety risks and take steps to avoid them.  Organize medications in a pill box for each day of the week.  Create a plan to handle any legal or financial matters. Get legal or financial advice if needed.  Keep a calendar in a central location to remind the person of appointments or other activities. Tips for reducing the risk of injury  Keep floors clear of clutter. Remove rugs, magazine racks, and floor lamps.  Keep hallways well lit, especially at night.  Put a handrail and nonslip mat in the bathtub or   shower.  Put childproof locks on cabinets that contain dangerous items, such as medicines, alcohol, guns, toxic cleaning items, sharp tools or utensils, matches, and lighters.  Put the locks in places where the person cannot see or reach them easily. This will help ensure that the person does not wander out of the house and get lost.  Be prepared for emergencies. Keep a list of emergency phone  numbers and addresses in a convenient area.  Remove car keys and lock garage doors so that the person does not try to get in the car and drive.  Have the person wear a bracelet that tracks locations and identifies the person as having memory problems. This should be worn at all times for safety. Where to find support: Many individuals and organizations offer support. These include:  Support groups for people with dementia and for caregivers.  Counselors or therapists.  Home health care services.  Adult day care centers. Where to find more information Alzheimer's Association: www.alz.org Contact a health care provider if:  The person's health is rapidly getting worse.  You are no longer able to care for the person.  Caring for the person is affecting your physical and emotional health.  The person threatens himself or herself, you, or anyone else. Summary  Dementia is a term used to describe a number of symptoms that affect memory and thinking.  Dementia usually gets worse slowly over time.  Take steps to reduce the person's risk of injury, and to plan for future care.  Caregivers need support, relief from caregiving, and time for their own lives. This information is not intended to replace advice given to you by your health care provider. Make sure you discuss any questions you have with your health care provider. Document Released: 04/09/2016 Document Revised: 04/09/2016 Document Reviewed: 04/09/2016 Elsevier Interactive Patient Education  2019 Elsevier Inc.  

## 2018-08-28 DIAGNOSIS — F0151 Vascular dementia with behavioral disturbance: Secondary | ICD-10-CM | POA: Diagnosis not present

## 2018-08-28 DIAGNOSIS — E854 Organ-limited amyloidosis: Secondary | ICD-10-CM | POA: Diagnosis not present

## 2018-08-28 DIAGNOSIS — I680 Cerebral amyloid angiopathy: Secondary | ICD-10-CM | POA: Diagnosis not present

## 2018-10-08 ENCOUNTER — Other Ambulatory Visit: Payer: Self-pay

## 2018-10-08 MED ORDER — METOPROLOL SUCCINATE ER 25 MG PO TB24: 25.0000 mg | ORAL_TABLET | Freq: Every day | ORAL | 1 refills | Status: DC

## 2018-11-26 ENCOUNTER — Ambulatory Visit: Payer: Self-pay | Admitting: Adult Health

## 2018-12-09 ENCOUNTER — Encounter: Payer: Self-pay | Admitting: Adult Health

## 2018-12-09 ENCOUNTER — Ambulatory Visit (INDEPENDENT_AMBULATORY_CARE_PROVIDER_SITE_OTHER): Payer: Medicare Other | Admitting: Adult Health

## 2018-12-09 ENCOUNTER — Other Ambulatory Visit: Payer: Self-pay

## 2018-12-09 VITALS — BP 139/79 | HR 69 | Resp 16 | Ht 63.0 in | Wt 154.0 lb

## 2018-12-09 DIAGNOSIS — R300 Dysuria: Secondary | ICD-10-CM | POA: Diagnosis not present

## 2018-12-09 DIAGNOSIS — Z0001 Encounter for general adult medical examination with abnormal findings: Secondary | ICD-10-CM

## 2018-12-09 DIAGNOSIS — E756 Lipid storage disorder, unspecified: Secondary | ICD-10-CM | POA: Diagnosis not present

## 2018-12-09 DIAGNOSIS — Z125 Encounter for screening for malignant neoplasm of prostate: Secondary | ICD-10-CM | POA: Diagnosis not present

## 2018-12-09 DIAGNOSIS — E785 Hyperlipidemia, unspecified: Secondary | ICD-10-CM

## 2018-12-09 DIAGNOSIS — F0150 Vascular dementia without behavioral disturbance: Secondary | ICD-10-CM

## 2018-12-09 DIAGNOSIS — I10 Essential (primary) hypertension: Secondary | ICD-10-CM

## 2018-12-09 DIAGNOSIS — R946 Abnormal results of thyroid function studies: Secondary | ICD-10-CM | POA: Diagnosis not present

## 2018-12-09 NOTE — Progress Notes (Signed)
[facility] [place], [place] [address]  Internal MEDICINE  Office Visit Note  Patient Name: Carlos Fischer  [id number]  [id number]  Date of Service: 12/09/2018  Chief Complaint  Patient presents with  . Medical Management of Chronic Issues    4 month follow up   . Annual Exam    medicare wellness visit   . Hypertension  . Hyperlipidemia     HPI Pt is here for routine health maintenance examination.  Pt is a 68 yo hispanic male.  He has a history of vascular demenia, HTn, and HLD. He is accompanied by his son during today's visit.  Currently blood pressure is well controlled.  He has a history of HLD.      Current Medication: Outpatient Encounter Medications as of 12/09/2018  Medication Sig  . aspirin EC 81 MG tablet Take 81 mg by mouth daily.  Marland Kitchen atorvastatin (LIPITOR) 40 MG tablet Take 1 tablet (40 mg total) by mouth daily.  Marland Kitchen lisinopril (PRINIVIL,ZESTRIL) 10 MG tablet Take 1 tablet (10 mg total) by mouth daily.  Marland Kitchen LORazepam (ATIVAN) 0.5 MG tablet Take 0.5 mg by mouth.  . memantine (NAMENDA) 5 MG tablet Take 5 mg by mouth 2 (two) times daily.  . metoprolol succinate (TOPROL-XL) 25 MG 24 hr tablet Take 1 tablet (25 mg total) by mouth daily.   No facility-administered encounter medications on file as of 12/09/2018.     Surgical History: Past Surgical History:  Procedure Laterality Date  . CAROTID ENDARTERECTOMY  01/02/2012    Medical History: Past Medical History:  Diagnosis Date  . Carotid artery occlusion   . Carotid stenosis   . Hyperlipidemia   . Hypertension   . Stroke [facility])    Hemorrhagic stroke    Family History: Family History  Family history unknown: Yes      Review of Systems  Constitutional: Negative.  Negative for chills, fatigue and unexpected weight change.  HENT: Negative.  Negative for congestion, rhinorrhea, sneezing and sore throat.   Eyes: Negative for redness.  Respiratory: Negative.   Negative for cough, chest tightness and shortness of breath.   Cardiovascular: Negative.  Negative for chest pain and palpitations.  Gastrointestinal: Negative.  Negative for abdominal pain, constipation, diarrhea, nausea and vomiting.  Endocrine: Negative.   Genitourinary: Negative.  Negative for dysuria and frequency.  Musculoskeletal: Negative.  Negative for arthralgias, back pain, joint swelling and neck pain.  Skin: Negative.  Negative for rash.  Allergic/Immunologic: Negative.   Neurological: Negative.  Negative for tremors and numbness.  Hematological: Negative for adenopathy. Does not bruise/bleed easily.  Psychiatric/Behavioral: Negative.  Negative for behavioral problems, sleep disturbance and suicidal ideas. The patient is not nervous/anxious.      Vital Signs: BP 139/79   Pulse 69   Resp 16   Ht 5\' 3"  (1.6 m)   Wt 154 lb (69.9 kg)   SpO2 96%   BMI 27.28 kg/m    Physical Exam Vitals signs and nursing note reviewed.  Constitutional:      General: He is not in acute distress.    Appearance: He is well-developed. He is not diaphoretic.  HENT:     Head: Normocephalic and atraumatic.     Mouth/Throat:     Pharynx: No oropharyngeal exudate.  Eyes:     Pupils: Pupils are equal, round, and reactive to light.  Neck:     Musculoskeletal: Normal range of motion and neck supple.     Thyroid: No  thyromegaly.     Vascular: No JVD.     Trachea: No tracheal deviation.  Cardiovascular:     Rate and Rhythm: Normal rate and regular rhythm.     Heart sounds: Normal heart sounds. No murmur. No friction rub. No gallop.   Pulmonary:     Effort: Pulmonary effort is normal. No respiratory distress.     Breath sounds: Normal breath sounds. No wheezing or rales.  Chest:     Chest wall: No tenderness.  Abdominal:     Palpations: Abdomen is soft.     Tenderness: There is no abdominal tenderness. There is no guarding.  Musculoskeletal: Normal range of motion.  Lymphadenopathy:      Cervical: No cervical adenopathy.  Skin:    General: Skin is warm and dry.  Neurological:     Mental Status: He is alert and oriented to person, place, and time.     Cranial Nerves: No cranial nerve deficit.  Psychiatric:        Behavior: Behavior normal.        Thought Content: Thought content normal.        Judgment: Judgment normal.      LABS: No results found for this or any previous visit (from the past 2160 hour(s)).    Assessment/Plan: 1. Encounter for general adult medical examination with abnormal findings Pt is up to date on PHM - CBC with Differential/Platelet - Lipid Panel With LDL/HDL Ratio - TSH - T4, free - Comprehensive metabolic panel - PSA  2. Vascular dementia, uncomplicated ([place]) Stable.  - CBC with Differential/Platelet  3. Essential hypertension Stable, continue present management.   4. Hyperlipidemia, unspecified hyperlipidemia type Stable, most recent lipid panel is stable.  5. Dysuria - UA/M w/rflx Culture, Routine  General Counseling: [name] verbalizes understanding of the findings of todays visit and agrees with plan of treatment. I have discussed any further diagnostic evaluation that may be needed or ordered today. We also reviewed his medications today. he has been encouraged to call the office with any questions or concerns that should arise related to todays visit.   Orders Placed This Encounter  Procedures  . UA/M w/rflx Culture, Routine    No orders of the defined types were placed in this encounter.   Time spent: 30 Minutes   This patient was seen by Orson Gear AGNP-C in Collaboration with Dr Lavera Guise as a part of collaborative care agreement    Kendell Bane AGNP-C Internal Medicine

## 2018-12-10 LAB — COMPREHENSIVE METABOLIC PANEL
ALT: 48 IU/L — ABNORMAL HIGH (ref 0–44)
AST: 27 IU/L (ref 0–40)
Albumin/Globulin Ratio: 2 (ref 1.2–2.2)
Albumin: 4.6 g/dL (ref 3.8–4.8)
Alkaline Phosphatase: 101 IU/L (ref 39–117)
BUN/Creatinine Ratio: 14 (ref 10–24)
BUN: 10 mg/dL (ref 8–27)
Bilirubin Total: 0.5 mg/dL (ref 0.0–1.2)
CO2: 22 mmol/L (ref 20–29)
Calcium: 9.4 mg/dL (ref 8.6–10.2)
Chloride: 104 mmol/L (ref 96–106)
Creatinine, Ser: 0.74 mg/dL — ABNORMAL LOW (ref 0.76–1.27)
GFR calc Af Amer: 108 mL/min/{1.73_m2} (ref >59)
GFR calc non Af Amer: 93 mL/min/{1.73_m2} (ref >59)
Globulin, Total: 2.3 g/dL (ref 1.5–4.5)
Glucose: 108 mg/dL — ABNORMAL HIGH (ref 65–99)
Potassium: 4.6 mmol/L (ref 3.5–5.2)
Sodium: 143 mmol/L (ref 134–144)
Total Protein: 6.9 g/dL (ref 6.0–8.5)

## 2018-12-10 LAB — LIPID PANEL WITH LDL/HDL RATIO
Cholesterol, Total: 116 mg/dL (ref 100–199)
HDL: 40 mg/dL (ref >39)
LDL Calculated: 56 mg/dL (ref 0–99)
LDl/HDL Ratio: 1.4 ratio (ref 0.0–3.6)
Triglycerides: 99 mg/dL (ref 0–149)
VLDL Cholesterol Cal: 20 mg/dL (ref 5–40)

## 2018-12-10 LAB — UA/M W/RFLX CULTURE, ROUTINE
Bilirubin, UA: NEGATIVE
Glucose, UA: NEGATIVE
Ketones, UA: NEGATIVE
Leukocytes,UA: NEGATIVE
Nitrite, UA: NEGATIVE
Protein,UA: NEGATIVE
RBC, UA: NEGATIVE
Specific Gravity, UA: 1.022 (ref 1.005–1.030)
Urobilinogen, Ur: 0.2 mg/dL (ref 0.2–1.0)
pH, UA: 5 (ref 5.0–7.5)

## 2018-12-10 LAB — MICROSCOPIC EXAMINATION
Bacteria, UA: NONE SEEN
Casts: NONE SEEN /lpf
Epithelial Cells (non renal): NONE SEEN /hpf (ref 0–10)

## 2018-12-10 LAB — CBC WITH DIFFERENTIAL/PLATELET
Basophils Absolute: 0.1 10*3/uL (ref 0.0–0.2)
Basos: 1 %
EOS (ABSOLUTE): 1.3 10*3/uL — ABNORMAL HIGH (ref 0.0–0.4)
Eos: 14 %
Hematocrit: 42.5 % (ref 37.5–51.0)
Hemoglobin: 15 g/dL (ref 13.0–17.7)
Immature Grans (Abs): 0 10*3/uL (ref 0.0–0.1)
Immature Granulocytes: 0 %
Lymphocytes Absolute: 2.2 10*3/uL (ref 0.7–3.1)
Lymphs: 24 %
MCH: 31.9 pg (ref 26.6–33.0)
MCHC: 35.3 g/dL (ref 31.5–35.7)
MCV: 90 fL (ref 79–97)
Monocytes Absolute: 0.4 10*3/uL (ref 0.1–0.9)
Monocytes: 4 %
Neutrophils Absolute: 5.2 10*3/uL (ref 1.4–7.0)
Neutrophils: 57 %
Platelets: 212 10*3/uL (ref 150–450)
RBC: 4.7 x10E6/uL (ref 4.14–5.80)
RDW: 11.8 % (ref 11.6–15.4)
WBC: 9.2 10*3/uL (ref 3.4–10.8)

## 2018-12-10 LAB — PSA: Prostate Specific Ag, Serum: 0.6 ng/mL (ref 0.0–4.0)

## 2018-12-10 LAB — T4, FREE: Free T4: 1.09 ng/dL (ref 0.82–1.77)

## 2018-12-10 LAB — TSH: TSH: 2.1 u[IU]/mL (ref 0.450–4.500)

## 2019-02-24 DIAGNOSIS — F0151 Vascular dementia with behavioral disturbance: Secondary | ICD-10-CM | POA: Diagnosis not present

## 2019-02-24 DIAGNOSIS — E854 Organ-limited amyloidosis: Secondary | ICD-10-CM | POA: Diagnosis not present

## 2019-02-24 DIAGNOSIS — I680 Cerebral amyloid angiopathy: Secondary | ICD-10-CM | POA: Diagnosis not present

## 2019-02-26 ENCOUNTER — Other Ambulatory Visit: Payer: Self-pay

## 2019-02-26 ENCOUNTER — Encounter (INDEPENDENT_AMBULATORY_CARE_PROVIDER_SITE_OTHER): Payer: Self-pay | Admitting: Vascular Surgery

## 2019-02-26 ENCOUNTER — Ambulatory Visit (INDEPENDENT_AMBULATORY_CARE_PROVIDER_SITE_OTHER): Payer: Medicare Other

## 2019-02-26 ENCOUNTER — Ambulatory Visit (INDEPENDENT_AMBULATORY_CARE_PROVIDER_SITE_OTHER): Payer: Medicare Other | Admitting: Vascular Surgery

## 2019-02-26 VITALS — BP 155/91 | HR 67 | Resp 12 | Ht 62.21 in | Wt 153.0 lb

## 2019-02-26 DIAGNOSIS — I6523 Occlusion and stenosis of bilateral carotid arteries: Secondary | ICD-10-CM | POA: Diagnosis not present

## 2019-02-26 DIAGNOSIS — I10 Essential (primary) hypertension: Secondary | ICD-10-CM

## 2019-02-26 DIAGNOSIS — E785 Hyperlipidemia, unspecified: Secondary | ICD-10-CM

## 2019-02-26 NOTE — Progress Notes (Signed)
MRN : [id number]  Carlos Fischer is a 68 y.o. (April 27, 1951) male who presents with chief complaint of  Chief Complaint  Patient presents with   Follow-up  .  History of Present Illness: Patient returns in follow-up of his carotid disease.  He has had some intermittent headaches but no focal neurologic symptoms.  He is about 7 years status post right carotid endarterectomy.  Duplex today shows the right carotid endarterectomy site to be widely patent without recurrent stenosis.  The left carotid artery has mild disease in the 1 to 39% range.  Current Outpatient Medications  Medication Sig Dispense Refill   aspirin EC 81 MG tablet Take 81 mg by mouth daily.     atorvastatin (LIPITOR) 40 MG tablet Take 1 tablet (40 mg total) by mouth daily. 90 tablet 3   lisinopril (PRINIVIL,ZESTRIL) 10 MG tablet Take 1 tablet (10 mg total) by mouth daily. 90 tablet 3   memantine (NAMENDA) 5 MG tablet Take 5 mg by mouth 2 (two) times daily.     metoprolol succinate (TOPROL-XL) 25 MG 24 hr tablet Take 1 tablet (25 mg total) by mouth daily. 90 tablet 1   No current facility-administered medications for this visit.     Past Medical History:  Diagnosis Date   Carotid artery occlusion    Carotid stenosis    Hyperlipidemia    Hypertension    Stroke [facility])    Hemorrhagic stroke    Past Surgical History:  Procedure Laterality Date   CAROTID ENDARTERECTOMY  01/02/2012    Social History Social History   Tobacco Use   Smoking status: Former Smoker   Smokeless tobacco: Never Used  Substance Use Topics   Alcohol use: No    Frequency: Never   Drug use: No     Family History No bleeding or clotting disorders  No Known Allergies   REVIEW OF SYSTEMS (Negative unless checked)  Constitutional: [] Weight loss  [] Fever  [] Chills Cardiac: [] Chest pain   [] Chest pressure   [] Palpitations   [] Shortness of breath when laying flat   [] Shortness of breath at rest    [] Shortness of breath with exertion. Vascular:  [] Pain in legs with walking   [] Pain in legs at rest   [] Pain in legs when laying flat   [] Claudication   [] Pain in feet when walking  [] Pain in feet at rest  [] Pain in feet when laying flat   [] History of DVT   [] Phlebitis   [] Swelling in legs   [] Varicose veins   [] Non-healing ulcers Pulmonary:   [] Uses home oxygen   [] Productive cough   [] Hemoptysis   [] Wheeze  [] COPD   [] Asthma Neurologic:  [] Dizziness  [] Blackouts   [] Seizures   [] History of stroke   [] History of TIA  [] Aphasia   [] Temporary blindness   [] Dysphagia   [] Weakness or numbness in arms   [] Weakness or numbness in legs Musculoskeletal:  [x] Arthritis   [] Joint swelling   [] Joint pain   [] Low back pain Hematologic:  [] Easy bruising  [] Easy bleeding   [] Hypercoagulable state   [] Anemic  [] Hepatitis Gastrointestinal:  [] Blood in stool   [] Vomiting blood  [x] Gastroesophageal reflux/heartburn   [] Difficulty swallowing. Genitourinary:  [] Chronic kidney disease   [] Difficult urination  [] Frequent urination  [] Burning with urination   [] Blood in urine Skin:  [] Rashes   [] Ulcers   [] Wounds Psychological:  [] History of anxiety   []  History of major depression.  Physical Examination  Vitals:   02/26/19 0832  BP: Marland Kitchen)  155/91  Pulse: 67  Resp: 12  Weight: 153 lb (69.4 kg)  Height: 5' 2.21" (1.58 m)   Body mass index is 27.8 kg/m. Gen:  WD/WN, NAD Head: [place]/AT, No temporalis wasting. Ear/Nose/Throat: Hearing grossly intact, nares w/o erythema or drainage, trachea midline Eyes: Conjunctiva clear. Sclera non-icteric Neck: Supple.  No bruit  Pulmonary:  Good air movement, equal and clear to auscultation bilaterally.  Cardiac: RRR, No JVD Vascular:  Vessel Right Left  Radial Palpable Palpable                   Neurologic: CN 2-12 intact. Sensation grossly intact in extremities.  Symmetrical.  Speech is fluent. Motor exam as listed above. Psychiatric: Judgment intact, Mood & affect  appropriate for pt's clinical situation. Dermatologic: No rashes or ulcers noted.  No cellulitis or open wounds.      CBC Lab Results  Component Value Date   WBC 9.2 12/09/2018   HGB 15.0 12/09/2018   HCT 42.5 12/09/2018   MCV 90 12/09/2018   PLT 212 12/09/2018    BMET    Component Value Date/Time   NA 143 12/09/2018 1153   NA 139 01/03/2012 0604   K 4.6 12/09/2018 1153   K 3.6 01/03/2012 0604   CL 104 12/09/2018 1153   CL 106 01/03/2012 0604   CO2 22 12/09/2018 1153   CO2 26 01/03/2012 0604   GLUCOSE 108 (H) 12/09/2018 1153   GLUCOSE 114 (H) 01/03/2012 0604   BUN 10 12/09/2018 1153   BUN 5 (L) 01/03/2012 0604   CREATININE 0.74 (L) 12/09/2018 1153   CREATININE 0.61 01/03/2012 0604   CALCIUM 9.4 12/09/2018 1153   CALCIUM 8.5 01/03/2012 0604   GFRNONAA 93 12/09/2018 1153   GFRNONAA >60 01/03/2012 0604   GFRAA 108 12/09/2018 1153   GFRAA >60 01/03/2012 0604   CrCl cannot be calculated (Patient's most recent lab result is older than the maximum 21 days allowed.).  COAG Lab Results  Component Value Date   INR 1.0 04/07/2012   INR 1.0 01/03/2012    Radiology No results found.    Assessment/Plan Hyperlipidemia lipid control important in reducing the progression of atherosclerotic disease. Continue statin therapy   Essential hypertension blood pressure control important in reducing the progression of atherosclerotic disease. On appropriate oral medications.  Bilateral carotid artery stenosis Duplex today shows the right carotid endarterectomy site to be widely patent without recurrent stenosis.  The left carotid artery has mild disease in the 1 to 39% range.  Overall doing well from a carotid standpoint.  Continue current medical regimen including ASA and a statin agent.  Recheck in 1 year.    Leotis Pain, MD  02/26/2019 9:19 AM    This note was created with Dragon medical transcription system.  Any errors from dictation are purely unintentional

## 2019-02-26 NOTE — Assessment & Plan Note (Addendum)
Duplex today shows the right carotid endarterectomy site to be widely patent without recurrent stenosis.  The left carotid artery has mild disease in the 1 to 39% range.  Overall doing well from a carotid standpoint.  Continue current medical regimen including ASA and a statin agent.  Recheck in 1 year.

## 2019-03-04 ENCOUNTER — Other Ambulatory Visit: Payer: Self-pay | Admitting: Nurse Practitioner

## 2019-03-04 MED ORDER — METOPROLOL SUCCINATE ER 25 MG PO TB24: 25.0000 mg | ORAL_TABLET | Freq: Every day | ORAL | 1 refills | Status: DC

## 2019-03-08 ENCOUNTER — Other Ambulatory Visit: Payer: Self-pay | Admitting: Adult Health

## 2019-03-08 MED ORDER — METOPROLOL SUCCINATE ER 25 MG PO TB24: 25.0000 mg | ORAL_TABLET | Freq: Every day | ORAL | 1 refills | Status: DC

## 2019-03-10 ENCOUNTER — Encounter: Payer: Self-pay | Admitting: Adult Health

## 2019-03-10 ENCOUNTER — Ambulatory Visit (INDEPENDENT_AMBULATORY_CARE_PROVIDER_SITE_OTHER): Payer: Medicare Other | Admitting: Adult Health

## 2019-03-10 ENCOUNTER — Other Ambulatory Visit: Payer: Self-pay

## 2019-03-10 VITALS — BP 150/88 | HR 69 | Temp 97.4°F | Resp 16 | Ht 64.0 in | Wt 151.0 lb

## 2019-03-10 DIAGNOSIS — F0150 Vascular dementia without behavioral disturbance: Secondary | ICD-10-CM

## 2019-03-10 DIAGNOSIS — I10 Essential (primary) hypertension: Secondary | ICD-10-CM

## 2019-03-10 DIAGNOSIS — E785 Hyperlipidemia, unspecified: Secondary | ICD-10-CM

## 2019-03-10 DIAGNOSIS — Z23 Encounter for immunization: Secondary | ICD-10-CM | POA: Diagnosis not present

## 2019-03-10 MED ORDER — ATORVASTATIN CALCIUM 40 MG PO TABS: 40.0000 mg | ORAL_TABLET | Freq: Every day | ORAL | 3 refills | Status: DC

## 2019-03-10 MED ORDER — LISINOPRIL 10 MG PO TABS: 10.0000 mg | ORAL_TABLET | Freq: Every day | ORAL | 3 refills | Status: DC

## 2019-03-10 NOTE — Progress Notes (Addendum)
[facility] [place], [place] [address]  Internal MEDICINE  Office Visit Note  Patient Name: Carlos Fischer  [id number]  [id number]  Date of Service: 03/31/2019  Chief Complaint  Patient presents with  . Hypertension  . Hyperlipidemia    HPI  Pt is here for follow up on HTN, HLD, and vascular dementia. Overall he is doing well, his son is in the exam room with him and they deny any issues at this time.  He saw [facility] neurology and they increased his namenda to twice daily. Son reports the patients is much calmer, and easier to get a long with now that he's on namenda. His BP is slightly elevated today 150/88, Denies Chest pain, Shortness of breath, palpitations, headache, or blurred vision.    Current Medication: Outpatient Encounter Medications as of 03/10/2019  Medication Sig  . aspirin EC 81 MG tablet Take 81 mg by mouth daily.  Marland Kitchen atorvastatin (LIPITOR) 40 MG tablet Take 1 tablet (40 mg total) by mouth daily.  Marland Kitchen lisinopril (ZESTRIL) 10 MG tablet Take 1 tablet (10 mg total) by mouth daily.  . memantine (NAMENDA) 5 MG tablet Take 5 mg by mouth 2 (two) times daily.  . metoprolol succinate (TOPROL-XL) 25 MG 24 hr tablet Take 1 tablet (25 mg total) by mouth daily.  . [DISCONTINUED] atorvastatin (LIPITOR) 40 MG tablet Take 1 tablet (40 mg total) by mouth daily.  . [DISCONTINUED] lisinopril (PRINIVIL,ZESTRIL) 10 MG tablet Take 1 tablet (10 mg total) by mouth daily.   No facility-administered encounter medications on file as of 03/10/2019.     Surgical History: Past Surgical History:  Procedure Laterality Date  . CAROTID ENDARTERECTOMY  01/02/2012    Medical History: Past Medical History:  Diagnosis Date  . Carotid artery occlusion   . Carotid stenosis   . Hyperlipidemia   . Hypertension   . Stroke [facility])    Hemorrhagic stroke    Family History: Family History  Family history unknown: Yes    Social History   Socioeconomic  History  . Marital status: Unknown    Spouse name: Not on file  . Number of children: Not on file  . Years of education: Not on file  . Highest education level: Not on file  Occupational History  . Not on file  Social Needs  . Financial resource strain: Not on file  . Food insecurity    Worry: Not on file    Inability: Not on file  . Transportation needs    Medical: Not on file    Non-medical: Not on file  Tobacco Use  . Smoking status: Former Research scientist (life sciences)  . Smokeless tobacco: Never Used  Substance and Sexual Activity  . Alcohol use: No    Frequency: Never  . Drug use: No  . Sexual activity: Not on file  Lifestyle  . Physical activity    Days per week: Not on file    Minutes per session: Not on file  . Stress: Not on file  Relationships  . Social Herbalist on phone: Not on file    Gets together: Not on file    Attends religious service: Not on file    Active member of club or organization: Not on file    Attends meetings of clubs or organizations: Not on file    Relationship status: Not on file  . Intimate partner violence    Fear of current or ex partner: Not on file  Emotionally abused: Not on file    Physically abused: Not on file    Forced sexual activity: Not on file  Other Topics Concern  . Not on file  Social History Narrative  . Not on file      Review of Systems  Constitutional: Negative.  Negative for chills, fatigue and unexpected weight change.  HENT: Negative.  Negative for congestion, rhinorrhea, sneezing and sore throat.   Eyes: Negative for redness.  Respiratory: Negative.  Negative for cough, chest tightness and shortness of breath.   Cardiovascular: Negative.  Negative for chest pain and palpitations.  Gastrointestinal: Negative.  Negative for abdominal pain, constipation, diarrhea, nausea and vomiting.  Endocrine: Negative.   Genitourinary: Negative.  Negative for dysuria and frequency.  Musculoskeletal: Negative.  Negative for  arthralgias, back pain, joint swelling and neck pain.  Skin: Negative.  Negative for rash.  Allergic/Immunologic: Negative.   Neurological: Negative.  Negative for tremors and numbness.  Hematological: Negative for adenopathy. Does not bruise/bleed easily.  Psychiatric/Behavioral: Negative.  Negative for behavioral problems, sleep disturbance and suicidal ideas. The patient is not nervous/anxious.     Vital Signs: BP (!) 150/88   Pulse 69   Temp (!) 97.4 F (36.3 C)   Resp 16   Ht 5\' 4"  (1.626 m)   Wt 151 lb (68.5 kg)   SpO2 97%   BMI 25.92 kg/m    Physical Exam Vitals signs and nursing note reviewed.  Constitutional:      General: He is not in acute distress.    Appearance: He is well-developed. He is not diaphoretic.  HENT:     Head: Normocephalic and atraumatic.     Mouth/Throat:     Pharynx: No oropharyngeal exudate.  Eyes:     Pupils: Pupils are equal, round, and reactive to light.  Neck:     Musculoskeletal: Normal range of motion and neck supple.     Thyroid: No thyromegaly.     Vascular: No JVD.     Trachea: No tracheal deviation.  Cardiovascular:     Rate and Rhythm: Normal rate and regular rhythm.     Heart sounds: Normal heart sounds. No murmur. No friction rub. No gallop.   Pulmonary:     Effort: Pulmonary effort is normal. No respiratory distress.     Breath sounds: Normal breath sounds. No wheezing or rales.  Chest:     Chest wall: No tenderness.  Abdominal:     Palpations: Abdomen is soft.     Tenderness: There is no abdominal tenderness. There is no guarding.  Musculoskeletal: Normal range of motion.  Lymphadenopathy:     Cervical: No cervical adenopathy.  Skin:    General: Skin is warm and dry.  Neurological:     Mental Status: He is alert and oriented to person, place, and time.     Cranial Nerves: No cranial nerve deficit.  Psychiatric:        Behavior: Behavior normal.        Thought Content: Thought content normal.        Judgment:  Judgment normal.    Assessment/Plan: 1. Essential hypertension pts pressure 150/88 at this time.   2. Hyperlipidemia, unspecified hyperlipidemia type Well controlled, most recent lipid panel WNL.   3. Vascular dementia, uncomplicated (HCC) Stable, continue to take namenda and other medications.   General Counseling: Carlos Fischer verbalizes understanding of the findings of todays visit and agrees with plan of treatment. I have discussed any further diagnostic evaluation that may  be needed or ordered today. We also reviewed his medications today. he has been encouraged to call the office with any questions or concerns that should arise related to todays visit. Hypertension Counseling:   The following hypertensive lifestyle modification were recommended and discussed:  1. Limiting alcohol intake to less than 1 oz/day of ethanol:(24 oz of beer or 8 oz of wine or 2 oz of 100-proof whiskey). 2. Take baby ASA 81 mg daily. 3. Importance of regular aerobic exercise and losing weight. 4. Reduce dietary saturated fat and cholesterol intake for overall cardiovascular health. 5. Maintaining adequate dietary potassium, calcium, and magnesium intake. 6. Regular monitoring of the blood pressure. 7. Reduce sodium intake to less than 100 mmol/day (less than 2.3 gm of sodium or less than 6 gm of sodium choride)    No orders of the defined types were placed in this encounter.   Meds ordered this encounter  Medications  . lisinopril (ZESTRIL) 10 MG tablet    Sig: Take 1 tablet (10 mg total) by mouth daily.    Dispense:  90 tablet    Refill:  3  . atorvastatin (LIPITOR) 40 MG tablet    Sig: Take 1 tablet (40 mg total) by mouth daily.    Dispense:  90 tablet    Refill:  3    Time spent: 25 Minutes   This patient was seen by Orson Gear AGNP-C in Collaboration with Dr Lavera Guise as a part of collaborative care agreement     Kendell Bane AGNP-C Internal medicine

## 2019-03-31 NOTE — Addendum Note (Signed)
Addended by: Kendell Bane on: 03/31/2019 12:36 PM   Modules accepted: Level of Service

## 2019-06-20 IMAGING — CT CT HEAD W/O CM
3 of 4 series · 14 of 47 positions shown, 16 images · non-contrast
Comparison: Brain MRI March 08, 2018

CLINICAL DATA: Visual disturbances. Multiple prior brain
hemorrhages

EXAM:
CT HEAD WITHOUT CONTRAST
TECHNIQUE: Contiguous axial images were obtained from the base of the skull
through the vertex without intravenous contrast.

[Series 2: head · axial · 0.39mm/px · z∈[-540,-413]mm · 8 of 32 slices shown, 10 images (1 of 3)]
[im 3/32  brain]
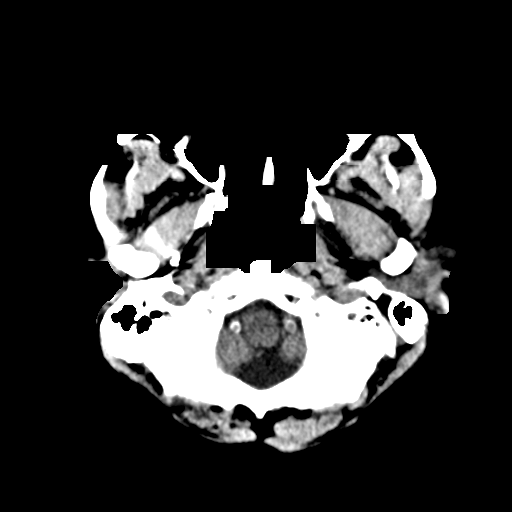
[im 3/32  bone]
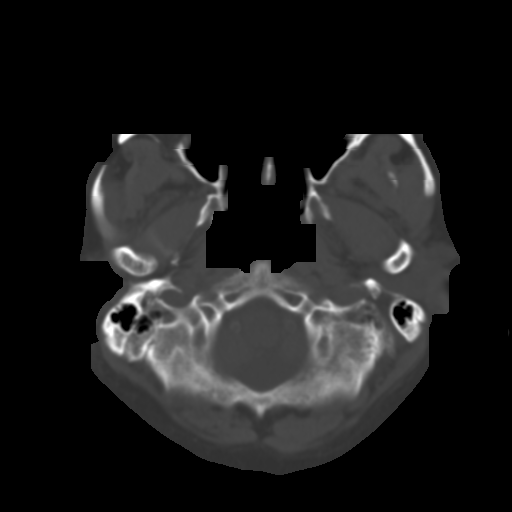
[im 7/32  brain]
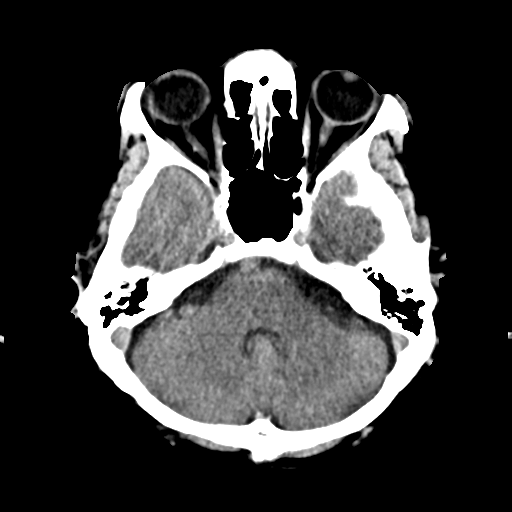
[im 12/32  brain]
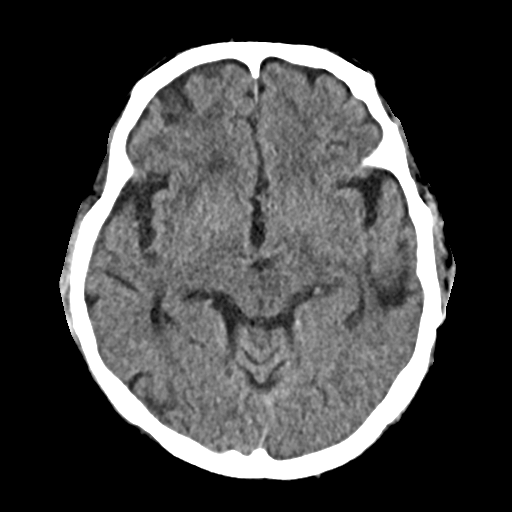
[im 14/32  brain]
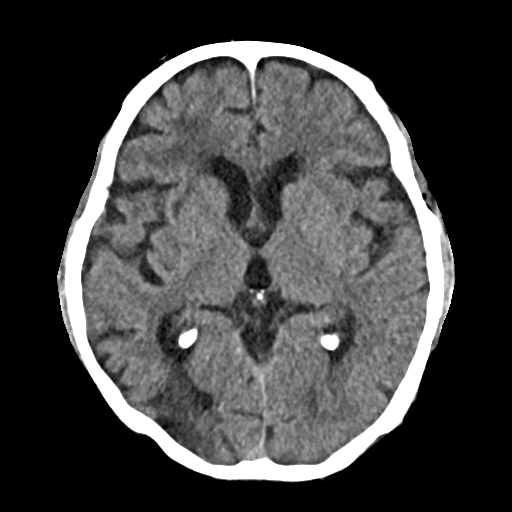
[im 18/32  brain]
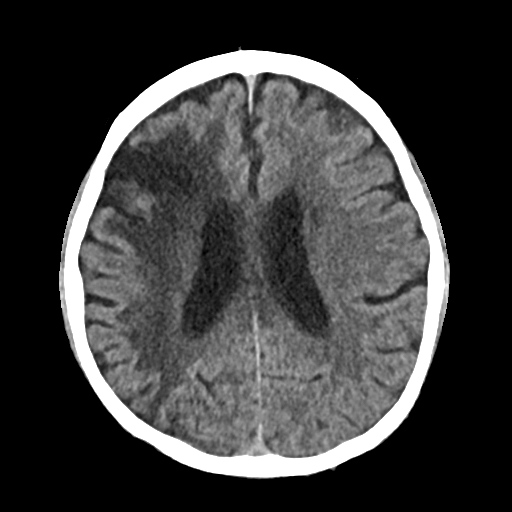
[im 18/32  bone]
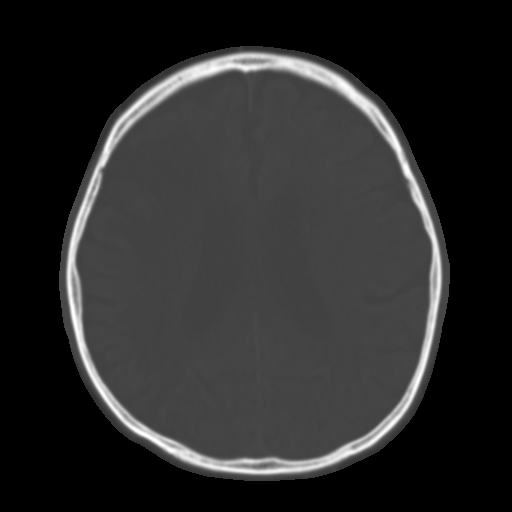
[im 20/32  brain]
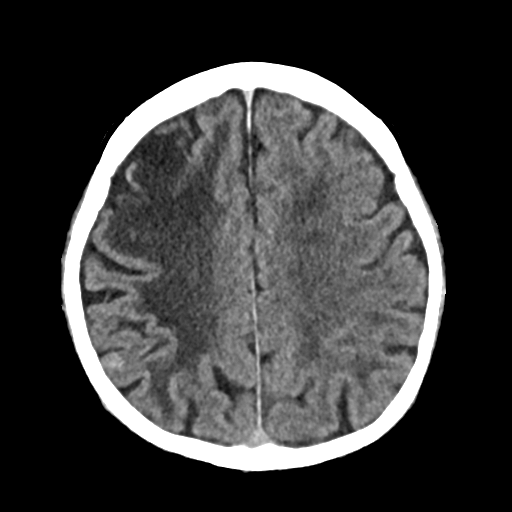
[im 25/32  brain]
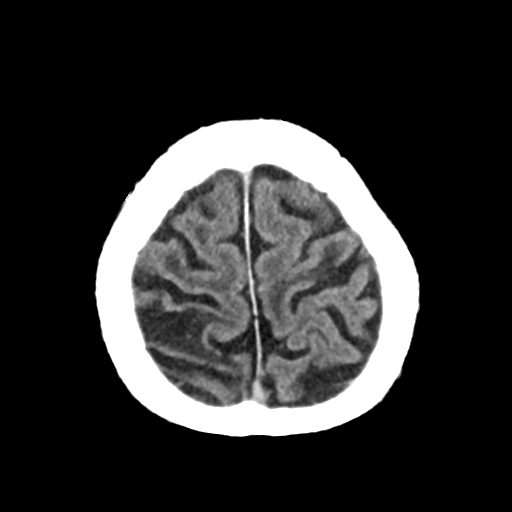
[im 29/32  brain]
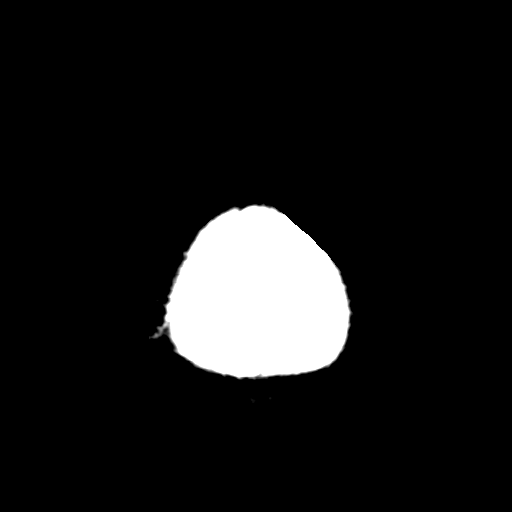

[Series 6: head · coronal · 0.31mm/px · 3 of 66 slices shown (2 of 3)]
[im 22/66  brain]
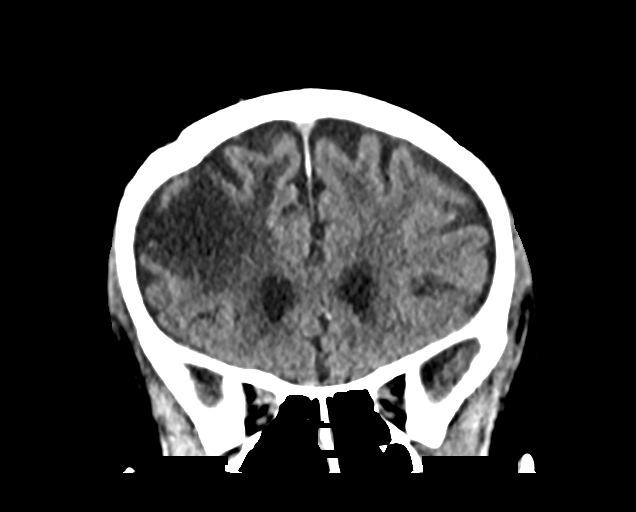
[im 29/66  brain]
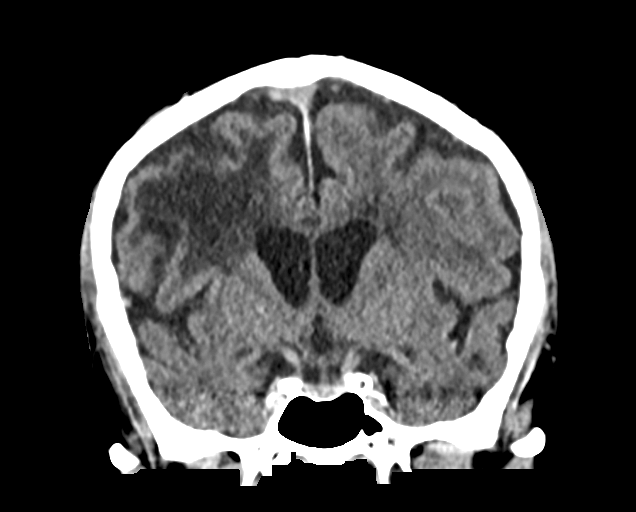
[im 37/66  brain]
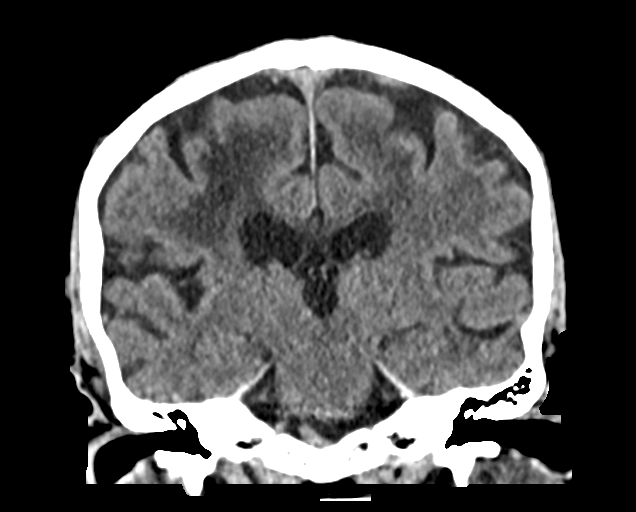

[Series 8: head · sagittal · 0.31mm/px · 3 of 58 slices shown (3 of 3)]
[im 20/58  brain]
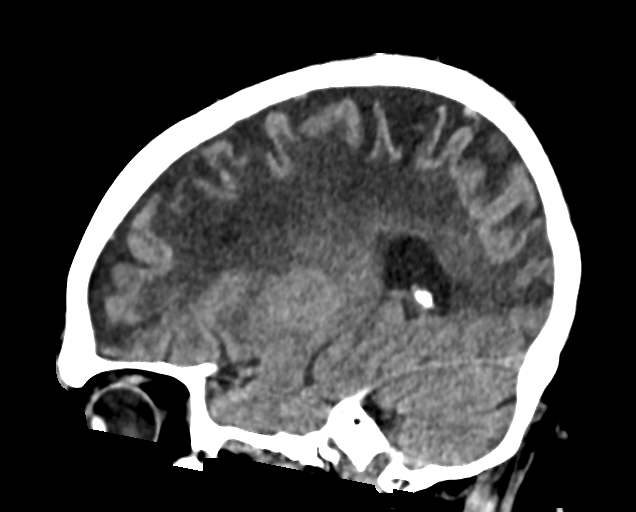
[im 29/58  brain]
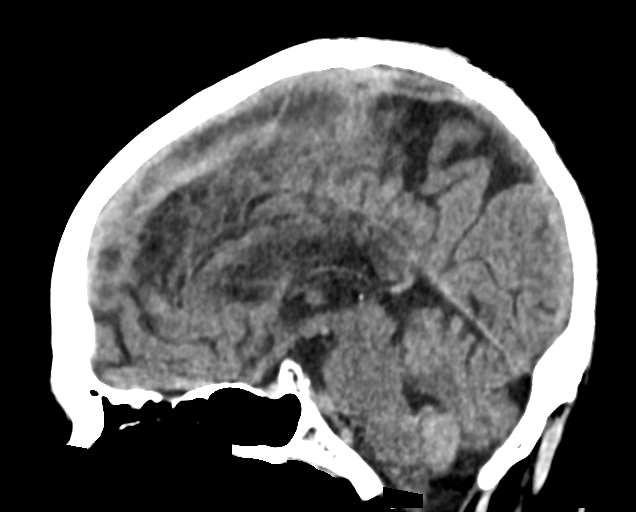
[im 39/58  brain]
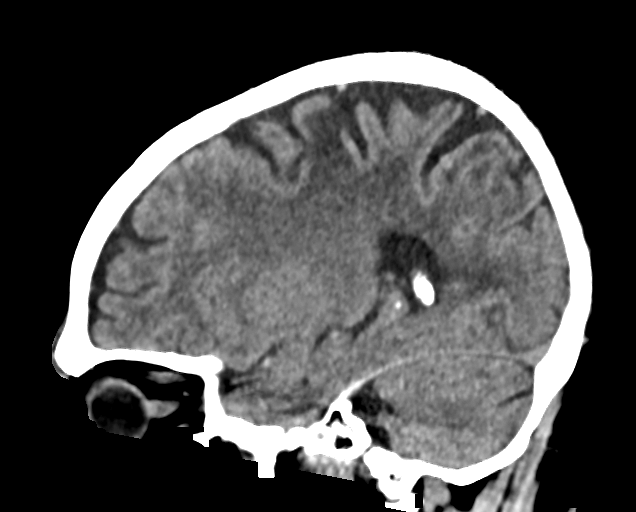

[14 of 47 positions shown; findings below may reference images not displayed]

FINDINGS: Brain: There is mild diffuse atrophy. No acute hemorrhage is evident
on CT examination. No mass is appreciable. There is no extra-axial
fluid collection or midline shift. There is decreased attenuation
throughout much of the right frontal, parietal, and occipital lobes
consistent with prior infarcts in these areas. There is decreased
attenuation throughout much of the right external capsule and
immediately adjacent anterior right lentiform nucleus consistent
with prior infarcts in these areas. There is patchy periventricular
small vessel disease in the centra semiovale bilaterally. No acute
appearing infarct is evident by CT.

Vascular: No hyperdense vessels. There is calcification in each
carotid siphon region.

Skull: Bony calvarium appears intact.

Sinuses/Orbits: There is mucosal thickening in several ethmoid air
cells with opacification in the periphery of each anterior ethmoid
region. Frontal sinuses are hypoplastic. Visualized paranasal
sinuses elsewhere clear. Orbits appear symmetric bilaterally.

Other: Mastoid air cells are clear.
IMPRESSION: 1. Extensive prior infarcts involving portions of the right frontal,
parietal, occipital lobes. Elsewhere there are prior infarcts in the
right basal ganglia region as well as small vessel disease in the
centra semiovale bilaterally. No evident acute infarct.

2.  No new foci of hemorrhage evident by CT.  No mass evident.

3.  Foci of arterial vascular calcification noted.

4.  Areas of ethmoid sinus disease bilaterally.

## 2019-08-04 ENCOUNTER — Other Ambulatory Visit: Payer: Self-pay

## 2019-08-04 MED ORDER — METOPROLOL SUCCINATE ER 25 MG PO TB24: 25.0000 mg | ORAL_TABLET | Freq: Every day | ORAL | 1 refills | Status: DC

## 2019-08-09 ENCOUNTER — Other Ambulatory Visit: Payer: Self-pay

## 2019-08-09 MED ORDER — METOPROLOL SUCCINATE ER 25 MG PO TB24: 25.0000 mg | ORAL_TABLET | Freq: Every day | ORAL | 1 refills | Status: DC

## 2019-09-20 ENCOUNTER — Telehealth: Payer: Self-pay

## 2019-09-20 NOTE — Telephone Encounter (Signed)
Confirmed appointment on 09/22/2019 and screened for covid. klh 

## 2019-09-22 ENCOUNTER — Encounter: Payer: Self-pay | Admitting: Adult Health

## 2019-09-22 ENCOUNTER — Ambulatory Visit (INDEPENDENT_AMBULATORY_CARE_PROVIDER_SITE_OTHER): Payer: Medicare Other | Admitting: Adult Health

## 2019-09-22 ENCOUNTER — Other Ambulatory Visit: Payer: Self-pay

## 2019-09-22 VITALS — BP 138/88 | HR 74 | Temp 97.8°F | Resp 16 | Ht 64.0 in | Wt 154.0 lb

## 2019-09-22 DIAGNOSIS — F0150 Vascular dementia without behavioral disturbance: Secondary | ICD-10-CM

## 2019-09-22 DIAGNOSIS — I10 Essential (primary) hypertension: Secondary | ICD-10-CM

## 2019-09-22 DIAGNOSIS — E785 Hyperlipidemia, unspecified: Secondary | ICD-10-CM | POA: Diagnosis not present

## 2019-09-22 DIAGNOSIS — I6523 Occlusion and stenosis of bilateral carotid arteries: Secondary | ICD-10-CM

## 2019-09-22 NOTE — Progress Notes (Signed)
[facility] [place], [place] [address]  Internal MEDICINE  Office Visit Note  Patient Name: Carlos Fischer  [id number]  [id number]  Date of Service: 09/22/2019  Chief Complaint  Patient presents with  . Follow-up  . Hypertension  . Hyperlipidemia    HPI Pt is here for follow up on HTN, HLD and vascular dementia.  His son is present in the exam room.  He reports his dad has been at baseline since our last visit.  He has had his covid shots, and is  Now venturing out some.  He is pleasant and cooperative today.  Denies any pain or need.  He takes namenda to assist with his memory.  His blood pressure is controlled today.  Denies Chest pain, Shortness of breath, palpitations, headache, or blurred vision.     Current Medication: Outpatient Encounter Medications as of 09/22/2019  Medication Sig  . aspirin EC 81 MG tablet Take 81 mg by mouth daily.  Marland Kitchen atorvastatin (LIPITOR) 40 MG tablet Take 1 tablet (40 mg total) by mouth daily.  Marland Kitchen lisinopril (ZESTRIL) 10 MG tablet Take 1 tablet (10 mg total) by mouth daily.  . memantine (NAMENDA) 5 MG tablet Take 5 mg by mouth 2 (two) times daily.  . metoprolol succinate (TOPROL-XL) 25 MG 24 hr tablet Take 1 tablet (25 mg total) by mouth daily.   No facility-administered encounter medications on file as of 09/22/2019.    Surgical History: Past Surgical History:  Procedure Laterality Date  . CAROTID ENDARTERECTOMY  01/02/2012    Medical History: Past Medical History:  Diagnosis Date  . Carotid artery occlusion   . Carotid stenosis   . Hyperlipidemia   . Hypertension   . Stroke [facility])    Hemorrhagic stroke    Family History: Family History  Family history unknown: Yes    Social History   Socioeconomic History  . Marital status: Unknown    Spouse name: Not on file  . Number of children: Not on file  . Years of education: Not on file  . Highest education level: Not on file  Occupational  History  . Not on file  Tobacco Use  . Smoking status: Former Research scientist (life sciences)  . Smokeless tobacco: Never Used  Substance and Sexual Activity  . Alcohol use: No  . Drug use: No  . Sexual activity: Not on file  Other Topics Concern  . Not on file  Social History Narrative  . Not on file   Social Determinants of Health   Financial Resource Strain:   . Difficulty of Paying Living Expenses:   Food Insecurity:   . Worried About Charity fundraiser in the Last Year:   . Arboriculturist in the Last Year:   Transportation Needs:   . Film/video editor (Medical):   Marland Kitchen Lack of Transportation (Non-Medical):   Physical Activity:   . Days of Exercise per Week:   . Minutes of Exercise per Session:   Stress:   . Feeling of Stress :   Social Connections:   . Frequency of Communication with Friends and Family:   . Frequency of Social Gatherings with Friends and Family:   . Attends Religious Services:   . Active Member of Clubs or Organizations:   . Attends Archivist Meetings:   Marland Kitchen Marital Status:   Intimate Partner Violence:   . Fear of Current or Ex-Partner:   . Emotionally Abused:   Marland Kitchen Physically Abused:   .  Sexually Abused:       Review of Systems  Constitutional: Negative.  Negative for chills, fatigue and unexpected weight change.  HENT: Negative.  Negative for congestion, rhinorrhea, sneezing and sore throat.   Eyes: Negative for redness.  Respiratory: Negative.  Negative for cough, chest tightness and shortness of breath.   Cardiovascular: Negative.  Negative for chest pain and palpitations.  Gastrointestinal: Negative.  Negative for abdominal pain, constipation, diarrhea, nausea and vomiting.  Endocrine: Negative.   Genitourinary: Negative.  Negative for dysuria and frequency.  Musculoskeletal: Negative.  Negative for arthralgias, back pain, joint swelling and neck pain.  Skin: Negative.  Negative for rash.  Allergic/Immunologic: Negative.   Neurological: Negative.   Negative for tremors and numbness.  Hematological: Negative for adenopathy. Does not bruise/bleed easily.  Psychiatric/Behavioral: Negative.  Negative for behavioral problems, sleep disturbance and suicidal ideas. The patient is not nervous/anxious.     Vital Signs: BP (!) 145/96   Pulse 74   Temp 97.8 F (36.6 C)   Resp 16   Ht 5\' 4"  (1.626 m)   Wt 154 lb (69.9 kg)   SpO2 96%   BMI 26.43 kg/m    Physical Exam Vitals and nursing note reviewed.  Constitutional:      General: He is not in acute distress.    Appearance: He is well-developed. He is not diaphoretic.  HENT:     Head: Normocephalic and atraumatic.     Mouth/Throat:     Pharynx: No oropharyngeal exudate.  Eyes:     Pupils: Pupils are equal, round, and reactive to light.  Neck:     Thyroid: No thyromegaly.     Vascular: No JVD.     Trachea: No tracheal deviation.  Cardiovascular:     Rate and Rhythm: Normal rate and regular rhythm.     Heart sounds: Normal heart sounds. No murmur. No friction rub. No gallop.   Pulmonary:     Effort: Pulmonary effort is normal. No respiratory distress.     Breath sounds: Normal breath sounds. No wheezing or rales.  Chest:     Chest wall: No tenderness.  Abdominal:     Palpations: Abdomen is soft.     Tenderness: There is no abdominal tenderness. There is no guarding.  Musculoskeletal:        General: Normal range of motion.     Cervical back: Normal range of motion and neck supple.  Lymphadenopathy:     Cervical: No cervical adenopathy.  Skin:    General: Skin is warm and dry.  Neurological:     Mental Status: He is alert and oriented to person, place, and time.     Cranial Nerves: No cranial nerve deficit.  Psychiatric:        Behavior: Behavior normal.        Thought Content: Thought content normal.        Judgment: Judgment normal.    Assessment/Plan: 1. Essential hypertension Controlled.  Encouraged patient to keep BP log at home and call for elevated  pressures.   2. Hyperlipidemia, unspecified hyperlipidemia type Continue Lipitor as prescribed.   3. Vascular dementia, uncomplicated (HCC) Stable, on namenda.  Follow up with neurology as scheduled.   4. Bilateral carotid artery stenosis Continue Lipitor as prescribed.   General Counseling: [name] verbalizes understanding of the findings of todays visit and agrees with plan of treatment. I have discussed any further diagnostic evaluation that may be needed or ordered today. We also reviewed his medications today.  he has been encouraged to call the office with any questions or concerns that should arise related to todays visit.    No orders of the defined types were placed in this encounter.   No orders of the defined types were placed in this encounter.   Time spent: 30 Minutes   This patient was seen by Orson Gear AGNP-C in Collaboration with Dr Lavera Guise as a part of collaborative care agreement     Kendell Bane AGNP-C Internal medicine

## 2019-09-29 DIAGNOSIS — F0151 Vascular dementia with behavioral disturbance: Secondary | ICD-10-CM | POA: Diagnosis not present

## 2019-09-29 DIAGNOSIS — E854 Organ-limited amyloidosis: Secondary | ICD-10-CM | POA: Diagnosis not present

## 2019-09-29 DIAGNOSIS — F0391 Unspecified dementia with behavioral disturbance: Secondary | ICD-10-CM | POA: Diagnosis not present

## 2019-09-29 DIAGNOSIS — I680 Cerebral amyloid angiopathy: Secondary | ICD-10-CM | POA: Diagnosis not present

## 2019-12-10 ENCOUNTER — Other Ambulatory Visit: Payer: Self-pay

## 2019-12-10 MED ORDER — LISINOPRIL 10 MG PO TABS: 10.0000 mg | ORAL_TABLET | Freq: Every day | ORAL | 3 refills | Status: DC

## 2019-12-10 MED ORDER — ATORVASTATIN CALCIUM 40 MG PO TABS: 40.0000 mg | ORAL_TABLET | Freq: Every day | ORAL | 3 refills | Status: DC

## 2019-12-27 ENCOUNTER — Telehealth: Payer: Self-pay

## 2019-12-27 NOTE — Telephone Encounter (Signed)
Unable to LVM for office visit 8/11

## 2019-12-29 ENCOUNTER — Ambulatory Visit: Payer: Medicare Other | Admitting: Adult Health

## 2020-02-29 ENCOUNTER — Encounter (INDEPENDENT_AMBULATORY_CARE_PROVIDER_SITE_OTHER): Payer: Medicare Other

## 2020-02-29 ENCOUNTER — Ambulatory Visit (INDEPENDENT_AMBULATORY_CARE_PROVIDER_SITE_OTHER): Payer: Medicare Other | Admitting: Vascular Surgery

## 2020-04-15 DIAGNOSIS — Z23 Encounter for immunization: Secondary | ICD-10-CM | POA: Diagnosis not present

## 2020-04-25 ENCOUNTER — Ambulatory Visit (INDEPENDENT_AMBULATORY_CARE_PROVIDER_SITE_OTHER): Payer: Medicare Other | Admitting: Nurse Practitioner

## 2020-04-25 ENCOUNTER — Encounter (INDEPENDENT_AMBULATORY_CARE_PROVIDER_SITE_OTHER): Payer: Medicare Other

## 2020-04-26 ENCOUNTER — Ambulatory Visit (INDEPENDENT_AMBULATORY_CARE_PROVIDER_SITE_OTHER): Payer: Medicare Other | Admitting: Hospice and Palliative Medicine

## 2020-04-26 ENCOUNTER — Encounter: Payer: Self-pay | Admitting: Hospice and Palliative Medicine

## 2020-04-26 ENCOUNTER — Other Ambulatory Visit: Payer: Self-pay

## 2020-04-26 VITALS — BP 136/90 | HR 73 | Temp 97.7°F | Resp 16 | Ht 62.0 in | Wt 142.6 lb

## 2020-04-26 DIAGNOSIS — Z0001 Encounter for general adult medical examination with abnormal findings: Secondary | ICD-10-CM

## 2020-04-26 DIAGNOSIS — I10 Essential (primary) hypertension: Secondary | ICD-10-CM

## 2020-04-26 DIAGNOSIS — Z79899 Other long term (current) drug therapy: Secondary | ICD-10-CM | POA: Diagnosis not present

## 2020-04-26 DIAGNOSIS — Z125 Encounter for screening for malignant neoplasm of prostate: Secondary | ICD-10-CM | POA: Diagnosis not present

## 2020-04-26 DIAGNOSIS — R300 Dysuria: Secondary | ICD-10-CM | POA: Diagnosis not present

## 2020-04-26 DIAGNOSIS — E785 Hyperlipidemia, unspecified: Secondary | ICD-10-CM

## 2020-04-26 NOTE — Progress Notes (Signed)
[facility] [place], [place] [address]  Internal MEDICINE  Office Visit Note  Patient Name: Carlos Fischer  [id number]  [id number]  Date of Service: 04/30/2020  Chief Complaint  Patient presents with  . Annual Exam  . Hypertension  . Hyperlipidemia  . policy update form    received     HPI Pt is here for routine health maintenance examination Son is present today during exam--helps with interpretation Overall, things have been going very well No major changes in his medications or history Followed by neurology for history of CVA-cerebral amyloid angiopathy, ischemic CVA, Oriental 2018 as well as vascular dementia--stable on current therapy No longer requires routine follow-up with neurology  Has not yet had annual labs for this year--orders need to be placed  Continues to be independent of ADL's, requires redirection at times, no recent changes in appetite or bowel habits, sleeps well each night  PHM Colonoscopy done 2016, normal--due 2026  Current Medication: Outpatient Encounter Medications as of 04/26/2020  Medication Sig  . aspirin EC 81 MG tablet Take 81 mg by mouth daily.  Marland Kitchen atorvastatin (LIPITOR) 40 MG tablet Take 1 tablet (40 mg total) by mouth daily.  Marland Kitchen lisinopril (ZESTRIL) 10 MG tablet Take 1 tablet (10 mg total) by mouth daily.  . memantine (NAMENDA) 5 MG tablet Take 5 mg by mouth 2 (two) times daily.  . metoprolol succinate (TOPROL-XL) 25 MG 24 hr tablet Take 1 tablet (25 mg total) by mouth daily.   No facility-administered encounter medications on file as of 04/26/2020.    Surgical History: Past Surgical History:  Procedure Laterality Date  . CAROTID ENDARTERECTOMY  01/02/2012    Medical History: Past Medical History:  Diagnosis Date  . Carotid artery occlusion   . Carotid stenosis   . Hyperlipidemia   . Hypertension   . Stroke [facility])    Hemorrhagic stroke    Family History: Family History  Problem  Relation Age of Onset  . Cancer Mother   . Kidney disease Father       Review of Systems  Constitutional: Negative for chills, fatigue and unexpected weight change.  HENT: Negative for congestion, postnasal drip, rhinorrhea, sneezing and sore throat.   Eyes: Negative for redness.  Respiratory: Negative for cough, chest tightness and shortness of breath.   Cardiovascular: Negative for chest pain, palpitations and leg swelling.  Gastrointestinal: Negative for abdominal pain, constipation, diarrhea, nausea and vomiting.  Endocrine: Negative for polydipsia, polyphagia and polyuria.  Genitourinary: Negative for dysuria and frequency.  Musculoskeletal: Negative for arthralgias, back pain, joint swelling and neck pain.  Skin: Negative for rash.  Neurological: Negative for tremors and numbness.  Hematological: Negative for adenopathy. Does not bruise/bleed easily.  Psychiatric/Behavioral: Negative for behavioral problems (Depression), sleep disturbance and suicidal ideas. The patient is not nervous/anxious.      Vital Signs: BP 136/90   Pulse 73   Temp 97.7 F (36.5 C)   Resp 16   Ht 5\' 2"  (1.575 m)   Wt 142 lb 9.6 oz (64.7 kg)   SpO2 98%   BMI 26.08 kg/m    Physical Exam Vitals reviewed.  Constitutional:      Appearance: Normal appearance. He is normal weight.  HENT:     Right Ear: Tympanic membrane normal.     Left Ear: Tympanic membrane normal.     Nose: Nose normal.     Mouth/Throat:     Mouth: Mucous membranes are moist.  Pharynx: Oropharynx is clear.  Eyes:     Pupils: Pupils are equal, round, and reactive to light.  Cardiovascular:     Rate and Rhythm: Normal rate and regular rhythm.     Pulses: Normal pulses.     Heart sounds: Normal heart sounds.  Pulmonary:     Effort: Pulmonary effort is normal.     Breath sounds: Normal breath sounds.  Abdominal:     General: Abdomen is flat.     Palpations: Abdomen is soft.  Musculoskeletal:        General: Normal  range of motion.     Cervical back: Normal range of motion.  Skin:    General: Skin is warm.  Neurological:     General: No focal deficit present.     Mental Status: He is alert and oriented to person, place, and time. Mental status is at baseline.  Psychiatric:        Mood and Affect: Mood normal.        Behavior: Behavior normal.        Thought Content: Thought content normal.        Judgment: Judgment normal.    LABS: Recent Results (from the past 2160 hour(s))  UA/M w/rflx Culture, Routine     Status: Abnormal   Collection Time: 04/26/20 10:59 AM   Specimen: Urine   Urine  Result Value Ref Range   Specific Gravity, UA 1.022 1.005 - 1.030   pH, UA 7.5 5.0 - 7.5   Color, UA Yellow Yellow   Appearance Ur Cloudy (A) Clear   Leukocytes,UA Negative Negative   Protein,UA Negative Negative/Trace   Glucose, UA Negative Negative   Ketones, UA Negative Negative   RBC, UA Negative Negative   Bilirubin, UA Negative Negative   Urobilinogen, Ur 1.0 0.2 - 1.0 mg/dL   Nitrite, UA Negative Negative   Microscopic Examination Comment     Comment: Microscopic follows if indicated.   Microscopic Examination See below:     Comment: Microscopic was indicated and was performed.   Urinalysis Reflex Comment     Comment: This specimen will not reflex to a Urine Culture.  Microscopic Examination     Status: None   Collection Time: 04/26/20 10:59 AM   Urine  Result Value Ref Range   WBC, UA None seen 0 - 5 /hpf   RBC None seen 0 - 2 /hpf   Epithelial Cells (non renal) None seen 0 - 10 /hpf   Casts None seen None seen /lpf   Bacteria, UA None seen None seen/Few     Assessment/Plan: 1. Encounter for routine adult health examination with abnormal findings Well appearing 69 year old male Will review routine annual labs and adjust plan of care as indicated Up to date on PHM - CBC w/Diff/Platelet - Comprehensive Metabolic Panel (CMET) - Lipid Panel With LDL/HDL Ratio - TSH + free T4 - PSA  Total (Reflex To Free)  2. Essential hypertension BP and HR well controlled on current therapy, continue with routine monitoring - CBC w/Diff/Platelet - Comprehensive Metabolic Panel (CMET) - Lipid Panel With LDL/HDL Ratio  3. Hyperlipidemia, unspecified hyperlipidemia type Review updated labs and adjust therapy as needed - Comprehensive Metabolic Panel (CMET) - Lipid Panel With LDL/HDL Ratio  4. Screening for prostate cancer - PSA Total (Reflex To Free)  5. High risk medication use - CBC w/Diff/Platelet - Comprehensive Metabolic Panel (CMET) - Lipid Panel With LDL/HDL Ratio - TSH + free T4  6. Dysuria -  UA/M w/rflx Culture, Routine - Microscopic Examination  General Counseling: Carlos Fischer verbalizes understanding of the findings of todays visit and agrees with plan of treatment. I have discussed any further diagnostic evaluation that may be needed or ordered today. We also reviewed his medications today. he has been encouraged to call the office with any questions or concerns that should arise related to todays visit.    Counseling:    Orders Placed This Encounter  Procedures  . Microscopic Examination  . UA/M w/rflx Culture, Routine  . CBC w/Diff/Platelet  . Comprehensive Metabolic Panel (CMET)  . Lipid Panel With LDL/HDL Ratio  . TSH + free T4  . PSA Total (Reflex To Free)      Total time spent: 30 Minutes  Time spent includes review of chart, medications, test results, and follow up plan with the patient.   This patient was seen by Casey Burkitt AGNP-C Collaboration with Dr Lavera Guise as a part of collaborative care agreement   Tanna Furry. [facility]

## 2020-04-27 ENCOUNTER — Other Ambulatory Visit: Payer: Self-pay | Admitting: Hospice and Palliative Medicine

## 2020-04-27 ENCOUNTER — Encounter (INDEPENDENT_AMBULATORY_CARE_PROVIDER_SITE_OTHER): Payer: Self-pay | Admitting: Nurse Practitioner

## 2020-04-27 ENCOUNTER — Ambulatory Visit (INDEPENDENT_AMBULATORY_CARE_PROVIDER_SITE_OTHER): Payer: Medicare Other | Admitting: Nurse Practitioner

## 2020-04-27 ENCOUNTER — Ambulatory Visit (INDEPENDENT_AMBULATORY_CARE_PROVIDER_SITE_OTHER): Payer: Medicare Other

## 2020-04-27 VITALS — BP 181/91 | HR 65 | Ht 62.0 in | Wt 143.0 lb

## 2020-04-27 DIAGNOSIS — I6523 Occlusion and stenosis of bilateral carotid arteries: Secondary | ICD-10-CM

## 2020-04-27 DIAGNOSIS — E785 Hyperlipidemia, unspecified: Secondary | ICD-10-CM | POA: Diagnosis not present

## 2020-04-27 DIAGNOSIS — Z79899 Other long term (current) drug therapy: Secondary | ICD-10-CM | POA: Diagnosis not present

## 2020-04-27 DIAGNOSIS — Z125 Encounter for screening for malignant neoplasm of prostate: Secondary | ICD-10-CM | POA: Diagnosis not present

## 2020-04-27 DIAGNOSIS — I10 Essential (primary) hypertension: Secondary | ICD-10-CM | POA: Diagnosis not present

## 2020-04-27 DIAGNOSIS — Z0001 Encounter for general adult medical examination with abnormal findings: Secondary | ICD-10-CM | POA: Diagnosis not present

## 2020-04-27 LAB — UA/M W/RFLX CULTURE, ROUTINE
Bilirubin, UA: NEGATIVE
Glucose, UA: NEGATIVE
Ketones, UA: NEGATIVE
Leukocytes,UA: NEGATIVE
Nitrite, UA: NEGATIVE
Protein,UA: NEGATIVE
RBC, UA: NEGATIVE
Specific Gravity, UA: 1.022 (ref 1.005–1.030)
Urobilinogen, Ur: 1 mg/dL (ref 0.2–1.0)
pH, UA: 7.5 (ref 5.0–7.5)

## 2020-04-27 LAB — MICROSCOPIC EXAMINATION
Bacteria, UA: NONE SEEN
Casts: NONE SEEN /lpf
Epithelial Cells (non renal): NONE SEEN /hpf (ref 0–10)
RBC, Urine: NONE SEEN /hpf (ref 0–2)
WBC, UA: NONE SEEN /hpf (ref 0–5)

## 2020-04-30 ENCOUNTER — Encounter: Payer: Self-pay | Admitting: Hospice and Palliative Medicine

## 2020-05-01 NOTE — Progress Notes (Signed)
Subjective:    Patient ID: Carlos Fischer, male    DOB: January 19, 1951, 69 y.o.   MRN: [id number] Chief Complaint  Patient presents with  . Follow-up    U/S follow up    The patient is seen for follow up evaluation of carotid stenosis. The carotid stenosis followed by ultrasound.   The patient denies amaurosis fugax. There is no recent history of TIA symptoms or focal motor deficits. There is no prior documented CVA.  The patient is taking enteric-coated aspirin 81 mg daily.  There is no history of migraine headaches. There is no history of seizures.  The patient has a history of coronary artery disease, no recent episodes of angina or shortness of breath. The patient denies PAD or claudication symptoms. There is a history of hyperlipidemia which is being treated with a statin.    Carotid Duplex done today shows <40%.  Right ICA is patent following endarterectomy in 2013.  No change compared to last study in 02/26/2019   Review of Systems  Eyes: Negative for visual disturbance.  Respiratory: Negative for shortness of breath.   All other systems reviewed and are negative.      Objective:   Physical Exam Vitals reviewed.  HENT:     Head: Normocephalic.  Neck:     Vascular: No carotid bruit.  Cardiovascular:     Rate and Rhythm: Normal rate.     Pulses: Normal pulses.  Pulmonary:     Effort: Pulmonary effort is normal.  Skin:    General: Skin is warm and dry.  Neurological:     Mental Status: He is alert and oriented to person, place, and time.  Psychiatric:        Mood and Affect: Mood normal.        Behavior: Behavior normal.        Thought Content: Thought content normal.        Judgment: Judgment normal.     BP (!) 181/91   Pulse 65   Ht 5\' 2"  (1.575 m)   Wt 143 lb (64.9 kg)   BMI 26.16 kg/m   Past Medical History:  Diagnosis Date  . Carotid artery occlusion   . Carotid stenosis   . Hyperlipidemia   . Hypertension   . Stroke [facility])     Hemorrhagic stroke    Social History   Socioeconomic History  . Marital status: Unknown    Spouse name: Not on file  . Number of children: Not on file  . Years of education: Not on file  . Highest education level: Not on file  Occupational History  . Not on file  Tobacco Use  . Smoking status: Former Research scientist (life sciences)  . Smokeless tobacco: Never Used  Vaping Use  . Vaping Use: Never used  Substance and Sexual Activity  . Alcohol use: No  . Drug use: No  . Sexual activity: Not on file  Other Topics Concern  . Not on file  Social History Narrative  . Not on file   Social Determinants of Health   Financial Resource Strain: Not on file  Food Insecurity: Not on file  Transportation Needs: Not on file  Physical Activity: Not on file  Stress: Not on file  Social Connections: Not on file  Intimate Partner Violence: Not on file    Past Surgical History:  Procedure Laterality Date  . CAROTID ENDARTERECTOMY  01/02/2012    Family History  Problem Relation Age of Onset  . Cancer Mother   .  Kidney disease Father     No Known Allergies  CBC Latest Ref Rng & Units 12/09/2018 04/07/2012 01/03/2012  WBC 3.4 - 10.8 x10E3/uL 9.2 - 12.0(H)  Hemoglobin 13.0 - 17.7 g/dL 15.0 - 13.7  Hematocrit 37.5 - 51.0 % 42.5 - 39.8(L)  Platelets 150 - 450 x10E3/uL 212 242 200      CMP     Component Value Date/Time   NA 143 12/09/2018 1153   NA 139 01/03/2012 0604   K 4.6 12/09/2018 1153   K 3.6 01/03/2012 0604   CL 104 12/09/2018 1153   CL 106 01/03/2012 0604   CO2 22 12/09/2018 1153   CO2 26 01/03/2012 0604   GLUCOSE 108 (H) 12/09/2018 1153   GLUCOSE 114 (H) 01/03/2012 0604   BUN 10 12/09/2018 1153   BUN 5 (L) 01/03/2012 0604   CREATININE 0.74 (L) 12/09/2018 1153   CREATININE 0.61 01/03/2012 0604   CALCIUM 9.4 12/09/2018 1153   CALCIUM 8.5 01/03/2012 0604   PROT 6.9 12/09/2018 1153   ALBUMIN 4.6 12/09/2018 1153   AST 27 12/09/2018 1153   ALT 48 (H) 12/09/2018 1153   ALKPHOS 101  12/09/2018 1153   BILITOT 0.5 12/09/2018 1153   GFRNONAA 93 12/09/2018 1153   GFRNONAA >60 01/03/2012 0604   GFRAA 108 12/09/2018 1153   GFRAA >60 01/03/2012 0604     No results found.     Assessment & Plan:   1. Bilateral carotid artery stenosis Recommend:  Given the patient's asymptomatic subcritical stenosis no further invasive testing or surgery at this time.  Duplex ultrasound shows <40% stenosis bilaterally.  Continue antiplatelet therapy as prescribed Continue management of CAD, HTN and Hyperlipidemia Healthy heart diet,  encouraged exercise at least 4 times per week Follow up in 12 months with duplex ultrasound and physical exam   2. Essential hypertension Continue antihypertensive medications as already ordered, these medications have been reviewed and there are no changes at this time.   3. Hyperlipidemia, unspecified hyperlipidemia type Continue statin as ordered and reviewed, no changes at this time    Current Outpatient Medications on File Prior to Visit  Medication Sig Dispense Refill  . aspirin EC 81 MG tablet Take 81 mg by mouth daily.    Marland Kitchen atorvastatin (LIPITOR) 40 MG tablet Take 1 tablet (40 mg total) by mouth daily. 90 tablet 3  . lisinopril (ZESTRIL) 10 MG tablet Take 1 tablet (10 mg total) by mouth daily. 90 tablet 3  . memantine (NAMENDA) 5 MG tablet Take 5 mg by mouth 2 (two) times daily.    . metoprolol succinate (TOPROL-XL) 25 MG 24 hr tablet Take 1 tablet (25 mg total) by mouth daily. 90 tablet 1   No current facility-administered medications on file prior to visit.    There are no Patient Instructions on file for this visit. No follow-ups on file.   Kris Hartmann, NP

## 2020-05-01 NOTE — Progress Notes (Signed)
Labs reviewed, will discuss at next follow-up visit.

## 2020-05-03 LAB — COMPREHENSIVE METABOLIC PANEL
ALT: 43 IU/L (ref 0–44)
AST: 23 IU/L (ref 0–40)
Albumin/Globulin Ratio: 1.6 (ref 1.2–2.2)
Albumin: 4.9 g/dL — ABNORMAL HIGH (ref 3.7–4.7)
Alkaline Phosphatase: 119 IU/L (ref 44–121)
BUN/Creatinine Ratio: 14 (ref 10–24)
BUN: 10 mg/dL (ref 8–27)
Bilirubin Total: 0.8 mg/dL (ref 0.0–1.2)
CO2: 26 mmol/L (ref 20–29)
Calcium: 9.5 mg/dL (ref 8.6–10.2)
Chloride: 100 mmol/L (ref 96–106)
Creatinine, Ser: 0.71 mg/dL — ABNORMAL LOW (ref 0.76–1.27)
GFR calc Af Amer: 109 mL/min/{1.73_m2} (ref >59)
GFR calc non Af Amer: 94 mL/min/{1.73_m2} (ref >59)
Globulin, Total: 3 g/dL (ref 1.5–4.5)
Glucose: 119 mg/dL — ABNORMAL HIGH (ref 65–99)
Potassium: 4.6 mmol/L (ref 3.5–5.2)
Sodium: 140 mmol/L (ref 134–144)
Total Protein: 7.9 g/dL (ref 6.0–8.5)

## 2020-05-03 LAB — LIPID PANEL W/O CHOL/HDL RATIO
Cholesterol, Total: 151 mg/dL (ref 100–199)
HDL: 49 mg/dL (ref >39)
LDL Chol Calc (NIH): 82 mg/dL (ref 0–99)
Triglycerides: 107 mg/dL (ref 0–149)
VLDL Cholesterol Cal: 20 mg/dL (ref 5–40)

## 2020-05-03 LAB — CBC WITH DIFFERENTIAL/PLATELET
Basophils Absolute: 0.1 10*3/uL (ref 0.0–0.2)
Basos: 1 %
EOS (ABSOLUTE): 2 10*3/uL — ABNORMAL HIGH (ref 0.0–0.4)
Eos: 19 %
Hematocrit: 50.1 % (ref 37.5–51.0)
Hemoglobin: 17 g/dL (ref 13.0–17.7)
Immature Grans (Abs): 0 10*3/uL (ref 0.0–0.1)
Immature Granulocytes: 0 %
Lymphocytes Absolute: 1.8 10*3/uL (ref 0.7–3.1)
Lymphs: 17 %
MCH: 32.2 pg (ref 26.6–33.0)
MCHC: 33.9 g/dL (ref 31.5–35.7)
MCV: 95 fL (ref 79–97)
Monocytes Absolute: 0.4 10*3/uL (ref 0.1–0.9)
Monocytes: 4 %
Neutrophils Absolute: 5.9 10*3/uL (ref 1.4–7.0)
Neutrophils: 59 %
Platelets: 228 10*3/uL (ref 150–450)
RBC: 5.28 x10E6/uL (ref 4.14–5.80)
RDW: 12.4 % (ref 11.6–15.4)
WBC: 10.2 10*3/uL (ref 3.4–10.8)

## 2020-05-03 LAB — PSA TOTAL (REFLEX TO FREE): Prostate Specific Ag, Serum: 0.7 ng/mL (ref 0.0–4.0)

## 2020-05-03 LAB — TSH+FREE T4
Free T4 by Dialysis: 1.5 ng/dL
TSH: 2.7 uU/mL

## 2020-08-09 ENCOUNTER — Encounter: Payer: Self-pay | Admitting: Hospice and Palliative Medicine

## 2020-08-09 ENCOUNTER — Other Ambulatory Visit: Payer: Self-pay

## 2020-08-09 ENCOUNTER — Ambulatory Visit: Payer: Medicare HMO | Admitting: Hospice and Palliative Medicine

## 2020-08-09 VITALS — BP 138/82 | HR 70 | Temp 97.6°F | Resp 16 | Ht 64.0 in | Wt 140.0 lb

## 2020-08-09 DIAGNOSIS — R3915 Urgency of urination: Secondary | ICD-10-CM | POA: Diagnosis not present

## 2020-08-09 DIAGNOSIS — R300 Dysuria: Secondary | ICD-10-CM | POA: Diagnosis not present

## 2020-08-09 LAB — POCT URINALYSIS DIPSTICK
Bilirubin, UA: NEGATIVE
Glucose, UA: NEGATIVE
Leukocytes, UA: NEGATIVE
Nitrite, UA: NEGATIVE
Protein, UA: NEGATIVE
Spec Grav, UA: 1.015 (ref 1.010–1.025)
Urobilinogen, UA: 0.2 E.U./dL
pH, UA: 6.5 (ref 5.0–8.0)

## 2020-08-09 NOTE — Progress Notes (Signed)
[facility] [place], [place] [address]  Internal MEDICINE  Office Visit Note  Patient Name: Carlos Fischer  [id number]  [id number]  Date of Service: 08/10/2020  Chief Complaint  Patient presents with  . Acute Visit    Increased urinary frequency, started 2 months ago, urgency then voids very little     HPI Pt is here for a sick visit. Here with his son whom acts as Optometrist C/o increased urinary frequency and urgency--symptoms initially started about 2 months ago, not getting worse, staying about the same When he voids he is only voiding a small amount each time Denies flank pain, lower abdominal pain or burning with urination Has not noticed any blood in urine  Current Medication:  Outpatient Encounter Medications as of 08/09/2020  Medication Sig  . aspirin EC 81 MG tablet Take 81 mg by mouth daily.  Marland Kitchen atorvastatin (LIPITOR) 40 MG tablet Take 1 tablet (40 mg total) by mouth daily.  Marland Kitchen lisinopril (ZESTRIL) 10 MG tablet Take 1 tablet (10 mg total) by mouth daily.  . memantine (NAMENDA) 5 MG tablet Take 5 mg by mouth 2 (two) times daily.  . metoprolol succinate (TOPROL-XL) 25 MG 24 hr tablet Take 1 tablet (25 mg total) by mouth daily.   No facility-administered encounter medications on file as of 08/09/2020.      Medical History: Past Medical History:  Diagnosis Date  . Carotid artery occlusion   . Carotid stenosis   . Hyperlipidemia   . Hypertension   . Stroke [facility])    Hemorrhagic stroke     Vital Signs: BP 138/82 Comment: 170/100  Pulse 70   Temp 97.6 F (36.4 C)   Resp 16   Ht 5\' 4"  (1.626 m)   Wt 140 lb (63.5 kg)   SpO2 98%   BMI 24.03 kg/m    Review of Systems  Constitutional: Negative for chills, fatigue and unexpected weight change.  HENT: Negative for congestion, postnasal drip, rhinorrhea, sneezing and sore throat.   Eyes: Negative for redness.  Respiratory: Negative for cough, chest tightness  and shortness of breath.   Cardiovascular: Negative for chest pain and palpitations.  Gastrointestinal: Negative for abdominal pain, constipation, diarrhea, nausea and vomiting.  Genitourinary: Positive for decreased urine volume, frequency and urgency. Negative for dysuria.  Musculoskeletal: Negative for arthralgias, back pain, joint swelling and neck pain.  Skin: Negative for rash.  Neurological: Negative for tremors and numbness.  Hematological: Negative for adenopathy. Does not bruise/bleed easily.  Psychiatric/Behavioral: Negative for behavioral problems (Depression), sleep disturbance and suicidal ideas. The patient is not nervous/anxious.     Physical Exam Vitals reviewed.  Constitutional:      Appearance: Normal appearance. He is normal weight.  Cardiovascular:     Rate and Rhythm: Normal rate and regular rhythm.     Pulses: Normal pulses.     Heart sounds: Normal heart sounds.  Pulmonary:     Effort: Pulmonary effort is normal.     Breath sounds: Normal breath sounds.  Abdominal:     General: Abdomen is flat.     Palpations: Abdomen is soft.     Tenderness: There is no right CVA tenderness or left CVA tenderness.  Musculoskeletal:        General: Normal range of motion.     Cervical back: Normal range of motion.  Skin:    General: Skin is warm.  Neurological:     General: No focal deficit present.  Mental Status: He is alert and oriented to person, place, and time. Mental status is at baseline.  Psychiatric:        Mood and Affect: Mood normal.        Behavior: Behavior normal.        Thought Content: Thought content normal.        Judgment: Judgment normal.    Assessment/Plan: 1. Urinary urgency Will await urine culture--will initiate antibiotic therapy as indicated based on results Possible BPH due to nature of symptoms, if culture negative consider trial of tamsulosin   2. Dysuria - POCT Urinalysis Dipstick - CULTURE, URINE COMPREHENSIVE  General  Counseling: Carlos Fischer verbalizes understanding of the findings of todays visit and agrees with plan of treatment. I have discussed any further diagnostic evaluation that may be needed or ordered today. We also reviewed his medications today. he has been encouraged to call the office with any questions or concerns that should arise related to todays visit.   Orders Placed This Encounter  Procedures  . CULTURE, URINE COMPREHENSIVE  . POCT Urinalysis Dipstick   Time spent: 25 Minutes  This patient was seen by Theodoro Grist AGNP-C in Collaboration with Dr Lavera Guise as a part of collaborative care agreement.  Tanna Furry [facility]

## 2020-08-10 ENCOUNTER — Encounter: Payer: Self-pay | Admitting: Hospice and Palliative Medicine

## 2020-08-14 ENCOUNTER — Other Ambulatory Visit: Payer: Self-pay

## 2020-08-14 ENCOUNTER — Other Ambulatory Visit: Payer: Self-pay | Admitting: Hospice and Palliative Medicine

## 2020-08-14 LAB — CULTURE, URINE COMPREHENSIVE

## 2020-08-14 MED ORDER — NITROFURANTOIN MONOHYD MACRO 100 MG PO CAPS: 100.0000 mg | ORAL_CAPSULE | Freq: Two times a day (BID) | ORAL | 0 refills | Status: DC

## 2020-08-14 NOTE — Progress Notes (Signed)
Please call his son, Cory Roughen and explain that based on his urine culture results he does have evidence of bacterial growth and I have sent Macrobid to his pharmacy for treatment.

## 2020-08-14 NOTE — Telephone Encounter (Signed)
Spoke to pt's son Leanna Sato and advised that pt has a bacterial infection that showed on his urine culture and that we are sending Macrobid 100 mg to his pharmacy and directions is take one capsule twice a day for 10 days.  He asked to have sent to [facility] on [address]

## 2020-08-14 NOTE — Progress Notes (Signed)
Please see my original result note, I to know which pharmacy to send his antibiotic to. Thanks!

## 2020-10-06 ENCOUNTER — Other Ambulatory Visit: Payer: Self-pay | Admitting: Nurse Practitioner

## 2020-10-06 ENCOUNTER — Other Ambulatory Visit: Payer: Self-pay

## 2020-10-06 ENCOUNTER — Ambulatory Visit: Payer: Medicare HMO | Admitting: Nurse Practitioner

## 2020-10-06 ENCOUNTER — Encounter: Payer: Self-pay | Admitting: Nurse Practitioner

## 2020-10-06 VITALS — BP 133/83 | HR 67 | Temp 97.8°F | Resp 16 | Ht 64.0 in | Wt 135.0 lb

## 2020-10-06 DIAGNOSIS — Z23 Encounter for immunization: Secondary | ICD-10-CM | POA: Diagnosis not present

## 2020-10-06 DIAGNOSIS — N400 Enlarged prostate without lower urinary tract symptoms: Secondary | ICD-10-CM

## 2020-10-06 DIAGNOSIS — F0281 Dementia in other diseases classified elsewhere with behavioral disturbance: Secondary | ICD-10-CM

## 2020-10-06 DIAGNOSIS — G301 Alzheimer's disease with late onset: Secondary | ICD-10-CM | POA: Diagnosis not present

## 2020-10-06 DIAGNOSIS — F02818 Dementia in other diseases classified elsewhere, unspecified severity, with other behavioral disturbance: Secondary | ICD-10-CM

## 2020-10-06 DIAGNOSIS — R300 Dysuria: Secondary | ICD-10-CM | POA: Diagnosis not present

## 2020-10-06 LAB — POCT URINALYSIS DIPSTICK
Bilirubin, UA: NEGATIVE
Blood, UA: NEGATIVE
Glucose, UA: NEGATIVE
Ketones, UA: NEGATIVE
Leukocytes, UA: NEGATIVE
Nitrite, UA: NEGATIVE
Protein, UA: NEGATIVE
Spec Grav, UA: 1.025 (ref 1.010–1.025)
Urobilinogen, UA: 0.2 E.U./dL
pH, UA: 5 (ref 5.0–8.0)

## 2020-10-06 MED ORDER — PNEUMOCOCCAL 20-VAL CONJ VACC 0.5 ML IM SUSY: 0.5000 mL | PREFILLED_SYRINGE | INTRAMUSCULAR | 0 refills | Status: AC

## 2020-10-06 MED ORDER — TAMSULOSIN HCL 0.4 MG PO CAPS: 0.4000 mg | ORAL_CAPSULE | Freq: Every day | ORAL | 3 refills | Status: DC

## 2020-10-06 NOTE — Progress Notes (Signed)
[facility] [place], [place] [address]  Internal MEDICINE  Office Visit Note  Patient Name: Carlos Fischer  [id number]  [id number]  Date of Service: 10/08/2020  Chief Complaint  Patient presents with  . Follow-up  . Hypertension  . Hyperlipidemia  . Urinary Frequency  . Quality Metric Gaps    Pneumonia vaccine     HPI Carlos Fischer presents for a follow up visit, accompanied by his son, regarding hypertension, hyperlipidemia, and urinary frequency. He also needs the pneumonia vaccine.  -Was seeing a neurologist for vascular dementia but they have signed off, but has been exhibiting more aggressive behavior. Discussed with patient's son. He plans to call the neurologist office and let them know about the change in behavior.  -Blood pressure controlled with lisinopril and metoprolol.  -Lipid profile was normal in December, taking atorvastatin, will recheck lipid panel with yearly labs in December 2022. -Urine specimen for urinalysis, normal. Weak stream of urine, and urinary hesitency.  -Need pna vaccine pcv 15 or 20 then follow in 1 year for ppsv23.  Current Medication: Outpatient Encounter Medications as of 10/06/2020  Medication Sig  . aspirin EC 81 MG tablet Take 81 mg by mouth daily.  Marland Kitchen atorvastatin (LIPITOR) 40 MG tablet Take 1 tablet (40 mg total) by mouth daily.  Marland Kitchen lisinopril (ZESTRIL) 10 MG tablet Take 1 tablet (10 mg total) by mouth daily.  . memantine (NAMENDA) 5 MG tablet Take 5 mg by mouth 2 (two) times daily.  . metoprolol succinate (TOPROL-XL) 25 MG 24 hr tablet Take 1 tablet (25 mg total) by mouth daily.  . [EXPIRED] pneumococcal 20-Val Conj Vacc (PREVNAR 20) 0.5 ML SUSY Inject 0.5 mLs into the muscle tomorrow at 10 am for 1 dose.  . [DISCONTINUED] pneumococcal 13-valent conjugate vaccine (PREVNAR 13) SUSP injection Inject 0.5 mLs into the muscle tomorrow at 10 am.  . [DISCONTINUED] tamsulosin (FLOMAX) 0.4 MG CAPS capsule  Take 1 capsule (0.4 mg total) by mouth daily.  . tamsulosin (FLOMAX) 0.4 MG CAPS capsule Take 1 capsule (0.4 mg total) by mouth daily.  . [DISCONTINUED] nitrofurantoin, macrocrystal-monohydrate, (MACROBID) 100 MG capsule Take 1 capsule (100 mg total) by mouth 2 (two) times daily. Take 1 capsule by mouth twice a day for 10 days   No facility-administered encounter medications on file as of 10/06/2020.    Surgical History: Past Surgical History:  Procedure Laterality Date  . CAROTID ENDARTERECTOMY  01/02/2012    Medical History: Past Medical History:  Diagnosis Date  . Carotid artery occlusion   . Carotid stenosis   . Hyperlipidemia   . Hypertension   . Stroke [facility])    Hemorrhagic stroke    Family History: Family History  Problem Relation Age of Onset  . Cancer Mother   . Kidney disease Father     Social History   Socioeconomic History  . Marital status: Unknown    Spouse name: Not on file  . Number of children: Not on file  . Years of education: Not on file  . Highest education level: Not on file  Occupational History  . Not on file  Tobacco Use  . Smoking status: Former Research scientist (life sciences)  . Smokeless tobacco: Never Used  Vaping Use  . Vaping Use: Never used  Substance and Sexual Activity  . Alcohol use: No  . Drug use: No  . Sexual activity: Not on file  Other Topics Concern  . Not on file  Social History Narrative  . Not on  file   Social Determinants of Health   Financial Resource Strain: Not on file  Food Insecurity: Not on file  Transportation Needs: Not on file  Physical Activity: Not on file  Stress: Not on file  Social Connections: Not on file  Intimate Partner Violence: Not on file      Review of Systems  Constitutional: Negative for chills, fatigue and unexpected weight change.  HENT: Positive for postnasal drip. Negative for congestion, rhinorrhea, sneezing and sore throat.   Eyes: Negative for redness.  Respiratory: Negative for cough, chest  tightness and shortness of breath.   Cardiovascular: Negative for chest pain and palpitations.  Gastrointestinal: Negative for abdominal pain, constipation, diarrhea, nausea and vomiting.  Genitourinary: Positive for difficulty urinating, dysuria and frequency.  Musculoskeletal: Negative for arthralgias, back pain, joint swelling and neck pain.  Skin: Negative for rash.  Neurological: Negative.  Negative for tremors and numbness.  Hematological: Negative for adenopathy. Does not bruise/bleed easily.  Psychiatric/Behavioral: Negative for behavioral problems (Depression), sleep disturbance and suicidal ideas. The patient is not nervous/anxious.     Vital Signs: BP 133/83   Pulse 67   Temp 97.8 F (36.6 C)   Resp 16   Ht 5\' 4"  (1.626 m)   Wt 135 lb (61.2 kg)   SpO2 97%   BMI 23.17 kg/m    Physical Exam Vitals reviewed.  Constitutional:      General: He is not in acute distress.    Appearance: Normal appearance. He is normal weight. He is not ill-appearing or toxic-appearing.  HENT:     Head: Normocephalic and atraumatic.  Cardiovascular:     Rate and Rhythm: Normal rate and regular rhythm.     Pulses: Normal pulses.     Heart sounds: Normal heart sounds.  Pulmonary:     Effort: Pulmonary effort is normal.     Breath sounds: Normal breath sounds.  Genitourinary:    Prostate: Enlarged. Not tender and no nodules present.     Rectum: Normal. No mass, tenderness, anal fissure, external hemorrhoid or internal hemorrhoid. Normal anal tone.  Skin:    General: Skin is warm and dry.     Capillary Refill: Capillary refill takes less than 2 seconds.  Neurological:     Mental Status: He is alert. Mental status is at baseline.     Motor: Weakness and tremor present.     Gait: Gait is intact.  Psychiatric:        Attention and Perception: Attention and perception normal.        Mood and Affect: Mood normal. Affect is flat.        Speech: Speech normal.        Behavior: Behavior is  cooperative.        Thought Content: Thought content normal.        Cognition and Memory: Cognition is impaired. Memory is impaired.        Judgment: Judgment is impulsive.    Assessment/Plan:  1. Prostate enlargement Enlarged prostate noted on digital rectal exam. Symptomatic with weak stream of urine, and urinary hesitancy and frequency. flomax prescribed, Lab ordered to assess PSA. Follow up in 1 month to assess effectiveness of flomax.  - PSA, total and free - tamsulosin (FLOMAX) 0.4 MG CAPS capsule; Take 1 capsule (0.4 mg total) by mouth daily.  Dispense: 30 capsule; Refill: 3  2. Dysuria Weak stream of urine, urinary hesitancy and frequency. Urinalysis normal.  - POCT Urinalysis Dipstick  3. Encounter for immunization  Age-appropriate vaccination prescribed and ssent to patient's pharmacy. PCV20 per CDC recommendations then follow up in 1 year for PPSV23 vaccination.  - pneumococcal 20-Val Conj Vacc (PREVNAR 20) 0.5 ML SUSY; Inject 0.5 mLs into the muscle tomorrow at 10 am for 1 dose.  Dispense: 0.5 mL; Refill: 0  4. Late onset Alzheimer's dementia with behavioral disturbance ([place]) Continue on current therapy   General Counseling: Carlos Fischer verbalizes understanding of the findings of todays visit and agrees with plan of treatment. I have discussed any further diagnostic evaluation that may be needed or ordered today. We also reviewed his medications today. he has been encouraged to call the office with any questions or concerns that should arise related to todays visit.    Orders Placed This Encounter  Procedures  . PSA, total and free  . POCT Urinalysis Dipstick    Meds ordered this encounter  Medications  . DISCONTD: tamsulosin (FLOMAX) 0.4 MG CAPS capsule    Sig: Take 1 capsule (0.4 mg total) by mouth daily.    Dispense:  30 capsule    Refill:  3  . pneumococcal 20-Val Conj Vacc (PREVNAR 20) 0.5 ML SUSY    Sig: Inject 0.5 mLs into the muscle tomorrow at 10 am for 1  dose.    Dispense:  0.5 mL    Refill:  0    Please administer at the pharmacy    Order Specific Question:   Supervising Provider    Answer:   Lavera Guise [[id number]]  . tamsulosin (FLOMAX) 0.4 MG CAPS capsule    Sig: Take 1 capsule (0.4 mg total) by mouth daily.    Dispense:  30 capsule    Refill:  3    Order Specific Question:   Supervising Provider    Answer:   Lavera Guise [[id number]]   Return in about 1 month (around 11/06/2020) for F/U , med refill, Review labs/test, Carlos Fischer PCP.  Total time spent:30 Minutes Time spent includes review of chart, medications, test results, and follow up plan with the patient.   [place] Controlled Substance Database was reviewed by me.  This patient was seen by Jonetta Osgood, FNP-C in collaboration with Dr. Clayborn Bigness as a part of collaborative care agreement.   Dr Lavera Guise Internal medicine

## 2020-10-07 LAB — PSA, TOTAL AND FREE
PSA, Free Pct: 21.3 %
PSA, Free: 0.17 ng/mL
Prostate Specific Ag, Serum: 0.8 ng/mL (ref 0.0–4.0)

## 2020-10-08 DIAGNOSIS — N400 Enlarged prostate without lower urinary tract symptoms: Secondary | ICD-10-CM | POA: Insufficient documentation

## 2020-10-16 ENCOUNTER — Other Ambulatory Visit: Payer: Self-pay | Admitting: Nurse Practitioner

## 2020-10-18 ENCOUNTER — Other Ambulatory Visit: Payer: Self-pay

## 2020-10-18 DIAGNOSIS — N400 Enlarged prostate without lower urinary tract symptoms: Secondary | ICD-10-CM

## 2020-10-18 MED ORDER — TAMSULOSIN HCL 0.4 MG PO CAPS
0.4000 mg | ORAL_CAPSULE | Freq: Every day | ORAL | 3 refills | Status: DC
Start: 2020-10-18 — End: 2021-05-08

## 2020-10-18 MED ORDER — MEMANTINE HCL 5 MG PO TABS: 5.0000 mg | ORAL_TABLET | Freq: Two times a day (BID) | ORAL | 1 refills | Status: DC

## 2020-10-18 MED ORDER — ATORVASTATIN CALCIUM 40 MG PO TABS: 40.0000 mg | ORAL_TABLET | Freq: Every day | ORAL | 3 refills | Status: DC

## 2020-10-18 MED ORDER — METOPROLOL SUCCINATE ER 25 MG PO TB24: 25.0000 mg | ORAL_TABLET | Freq: Every day | ORAL | 1 refills | Status: DC

## 2020-10-18 MED ORDER — LISINOPRIL 10 MG PO TABS: 10.0000 mg | ORAL_TABLET | Freq: Every day | ORAL | 3 refills | Status: DC

## 2020-10-26 ENCOUNTER — Ambulatory Visit: Payer: Medicare Other | Admitting: Hospice and Palliative Medicine

## 2020-10-27 ENCOUNTER — Other Ambulatory Visit: Payer: Self-pay | Admitting: Nurse Practitioner

## 2020-11-03 ENCOUNTER — Other Ambulatory Visit: Payer: Self-pay

## 2020-11-03 ENCOUNTER — Encounter: Payer: Self-pay | Admitting: Nurse Practitioner

## 2020-11-03 ENCOUNTER — Ambulatory Visit: Payer: Medicare HMO | Admitting: Nurse Practitioner

## 2020-11-03 VITALS — BP 128/80 | HR 66 | Temp 98.8°F | Resp 16 | Ht 62.0 in | Wt 139.4 lb

## 2020-11-03 DIAGNOSIS — I10 Essential (primary) hypertension: Secondary | ICD-10-CM | POA: Diagnosis not present

## 2020-11-03 DIAGNOSIS — G301 Alzheimer's disease with late onset: Secondary | ICD-10-CM | POA: Diagnosis not present

## 2020-11-03 DIAGNOSIS — F0281 Dementia in other diseases classified elsewhere with behavioral disturbance: Secondary | ICD-10-CM

## 2020-11-03 DIAGNOSIS — N400 Enlarged prostate without lower urinary tract symptoms: Secondary | ICD-10-CM | POA: Diagnosis not present

## 2020-11-03 NOTE — Progress Notes (Signed)
[facility] [place], [place] [address]  Internal MEDICINE  Office Visit Note  Patient Name: Carlos Fischer  [id number]  [id number]  Date of Service: 11/05/2020  Chief Complaint  Patient presents with   Follow-up    Med refill, review labs, review test, patient has also been experiencing jerking with in the last 3 days     HPI Carlos Fischer presents for a follow up visit with his grandson accompanying him to help translate. At his previous visit, the patient was started on flomax for symptomatic enlarged prostate, he had started having some episodes of increased aggression most likely related to his dementia.  His blood pressure remains well controlled today with current medications.  His urinary hesitancy and weak stream have improved. His PSA level was normal  as well.  He has recently started having some episodes of shaking and tremors, poor balance as well as intermittent episodes of aggression and confusion. His neurologist at [facility] neurology had previously signed off on his case. Discussed with the patient and his grandson that it would be a good idea to have the patient be seen by the neurologist again. The patient's grandson said he will call the neurologist's office.  I also called [facility] neurology and explained the patient's situation and the need for him to be seen again. The office said that they will call to make an appointment for the patient.     Current Medication: Outpatient Encounter Medications as of 11/03/2020  Medication Sig   aspirin EC 81 MG tablet Take 81 mg by mouth daily.   atorvastatin (LIPITOR) 40 MG tablet Take 1 tablet (40 mg total) by mouth daily.   lisinopril (ZESTRIL) 10 MG tablet Take 1 tablet (10 mg total) by mouth daily.   memantine (NAMENDA) 5 MG tablet Take 1 tablet (5 mg total) by mouth 2 (two) times daily.   metoprolol succinate (TOPROL-XL) 25 MG 24 hr tablet Take 1 tablet (25 mg total) by mouth daily.    tamsulosin (FLOMAX) 0.4 MG CAPS capsule Take 1 capsule (0.4 mg total) by mouth daily.   No facility-administered encounter medications on file as of 11/03/2020.    Surgical History: Past Surgical History:  Procedure Laterality Date   CAROTID ENDARTERECTOMY  01/02/2012    Medical History: Past Medical History:  Diagnosis Date   Carotid artery occlusion    Carotid stenosis    Hyperlipidemia    Hypertension    Stroke [facility])    Hemorrhagic stroke    Family History: Family History  Problem Relation Age of Onset   Cancer Mother    Kidney disease Father     Social History   Socioeconomic History   Marital status: Unknown    Spouse name: Not on file   Number of children: Not on file   Years of education: Not on file   Highest education level: Not on file  Occupational History   Not on file  Tobacco Use   Smoking status: Former    Pack years: 0.00   Smokeless tobacco: Never  Vaping Use   Vaping Use: Never used  Substance and Sexual Activity   Alcohol use: No   Drug use: No   Sexual activity: Not on file  Other Topics Concern   Not on file  Social History Narrative   Not on file   Social Determinants of Health   Financial Resource Strain: Not on file  Food Insecurity: Not on file  Transportation Needs: Not on file  Physical Activity: Not on file  Stress: Not on file  Social Connections: Not on file  Intimate Partner Violence: Not on file      Review of Systems  Constitutional:  Negative for chills, fatigue and unexpected weight change.  HENT:  Negative for congestion, rhinorrhea, sneezing and sore throat.   Eyes:  Negative for redness.  Respiratory:  Negative for cough, chest tightness and shortness of breath.   Cardiovascular:  Negative for chest pain and palpitations.  Gastrointestinal:  Negative for abdominal pain, constipation, diarrhea, nausea and vomiting.  Genitourinary:  Negative for difficulty urinating, dysuria, frequency and urgency.   Musculoskeletal:  Negative for arthralgias, back pain, joint swelling and neck pain.  Skin:  Negative for rash.  Neurological:  Positive for tremors and weakness. Negative for dizziness, light-headedness, numbness and headaches.  Hematological:  Negative for adenopathy. Does not bruise/bleed easily.  Psychiatric/Behavioral:  Positive for agitation. Negative for behavioral problems (Depression), sleep disturbance and suicidal ideas. The patient is not nervous/anxious.    Vital Signs: BP 128/80   Pulse 66   Temp 98.8 F (37.1 C)   Resp 16   Ht 5\' 2"  (1.575 m)   Wt 139 lb 6.4 oz (63.2 kg)   SpO2 98%   BMI 25.50 kg/m    Physical Exam Vitals reviewed.  Constitutional:      General: He is not in acute distress.    Appearance: Normal appearance. He is not ill-appearing.  HENT:     Head: Normocephalic and atraumatic.  Cardiovascular:     Rate and Rhythm: Normal rate and regular rhythm.     Pulses: Normal pulses.     Heart sounds: Normal heart sounds.  Pulmonary:     Effort: Pulmonary effort is normal.     Breath sounds: Normal breath sounds.  Skin:    General: Skin is warm.     Capillary Refill: Capillary refill takes less than 2 seconds.  Neurological:     Mental Status: He is alert. He is disoriented.       Assessment/Plan: 1. Late onset Alzheimer's dementia with behavioral disturbance [facility]) Patient was previously followed by [facility] neurology who had since signed off on his case. He is presenting with worsening symptoms of dementia including intermittent aggression, tremors, shakind difficulty with balance. [facility] neurology office was notified and will call the patient's contact person to schedule an appointment to assess patient's current status.   2. Essential hypertension Blood pressure continues to be well controlled with current medications, no changes.    3. Prostate enlargement Flomax was started at his previous office visit. This medication has been helping, he has  had decreased hesitancy and his stream of urine has improved. Will follow up again in 6 months.    General Counseling: [name] verbalizes understanding of the findings of todays visit and agrees with plan of treatment. I have discussed any further diagnostic evaluation that may be needed or ordered today. We also reviewed his medications today. he has been encouraged to call the office with any questions or concerns that should arise related to todays visit.    No orders of the defined types were placed in this encounter.   No orders of the defined types were placed in this encounter.   Return in about 6 months (around 05/05/2021) for F/U, BP check, Keoni Risinger PCP.   Total time spent:30 Minutes Time spent includes review of chart, medications, test results, and follow up plan with the patient.   [place] Controlled Substance Database was  reviewed by me.  This patient was seen by Jonetta Osgood, FNP-C in collaboration with Dr. Clayborn Bigness as a part of collaborative care agreement.   Tiphanie Vo R. Valetta Fuller, MSN, FNP-C Internal medicine

## 2020-11-05 DIAGNOSIS — F02818 Dementia in other diseases classified elsewhere, unspecified severity, with other behavioral disturbance: Secondary | ICD-10-CM | POA: Insufficient documentation

## 2020-12-22 ENCOUNTER — Other Ambulatory Visit: Payer: Self-pay | Admitting: Internal Medicine

## 2021-03-07 ENCOUNTER — Other Ambulatory Visit: Payer: Self-pay | Admitting: Internal Medicine

## 2021-04-24 ENCOUNTER — Encounter (INDEPENDENT_AMBULATORY_CARE_PROVIDER_SITE_OTHER): Payer: Medicare Other

## 2021-04-24 ENCOUNTER — Ambulatory Visit (INDEPENDENT_AMBULATORY_CARE_PROVIDER_SITE_OTHER): Payer: Medicare Other | Admitting: Vascular Surgery

## 2021-04-30 ENCOUNTER — Ambulatory Visit: Payer: Medicare HMO | Admitting: Nurse Practitioner

## 2021-05-03 ENCOUNTER — Telehealth: Payer: Self-pay

## 2021-05-03 NOTE — Telephone Encounter (Signed)
Patients son called on behave of dad. They are wanting lab orders to be put in for annual routine check up. His last labs were drawn on 04/27/2020. He is coming in on 05/08/2021 and they wish to have these done by then. Thanks in advance!

## 2021-05-03 NOTE — Telephone Encounter (Signed)
Called patient back and informed the family that the labs would be put in on the day of visit and if there is any abnormal labs they would be contacted per the nurse. Patients family agreed and understood.

## 2021-05-08 ENCOUNTER — Other Ambulatory Visit: Payer: Self-pay

## 2021-05-08 ENCOUNTER — Encounter: Payer: Self-pay | Admitting: Nurse Practitioner

## 2021-05-08 ENCOUNTER — Ambulatory Visit: Payer: Medicare HMO | Admitting: Nurse Practitioner

## 2021-05-08 VITALS — BP 138/80 | HR 70 | Temp 98.6°F | Resp 16 | Ht 64.0 in | Wt 136.0 lb

## 2021-05-08 DIAGNOSIS — N400 Enlarged prostate without lower urinary tract symptoms: Secondary | ICD-10-CM | POA: Diagnosis not present

## 2021-05-08 DIAGNOSIS — F01B11 Vascular dementia, moderate, with agitation: Secondary | ICD-10-CM | POA: Diagnosis not present

## 2021-05-08 DIAGNOSIS — R300 Dysuria: Secondary | ICD-10-CM | POA: Diagnosis not present

## 2021-05-08 DIAGNOSIS — Z23 Encounter for immunization: Secondary | ICD-10-CM

## 2021-05-08 LAB — POCT URINALYSIS DIPSTICK
Bilirubin, UA: NEGATIVE
Blood, UA: NEGATIVE
Glucose, UA: NEGATIVE
Ketones, UA: NEGATIVE
Leukocytes, UA: NEGATIVE
Nitrite, UA: NEGATIVE
Protein, UA: NEGATIVE
Spec Grav, UA: >=1.03 — AB (ref 1.010–1.025)
Urobilinogen, UA: 0.2 E.U./dL
pH, UA: 6 (ref 5.0–8.0)

## 2021-05-08 MED ORDER — MEMANTINE HCL 5 MG PO TABS: 10.0000 mg | ORAL_TABLET | Freq: Two times a day (BID) | ORAL | 1 refills | Status: DC

## 2021-05-08 MED ORDER — RISPERIDONE 0.5 MG PO TABS: 0.5000 mg | ORAL_TABLET | Freq: Every day | ORAL | 1 refills | Status: DC

## 2021-05-08 MED ORDER — TAMSULOSIN HCL 0.4 MG PO CAPS: 0.4000 mg | ORAL_CAPSULE | Freq: Every day | ORAL | 5 refills | Status: DC

## 2021-05-08 MED ORDER — PNEUMOCOCCAL 20-VAL CONJ VACC 0.5 ML IM SUSY: 0.5000 mL | PREFILLED_SYRINGE | INTRAMUSCULAR | 0 refills | Status: AC

## 2021-05-08 MED ORDER — TAMSULOSIN HCL 0.4 MG PO CAPS: 0.8000 mg | ORAL_CAPSULE | Freq: Every day | ORAL | 1 refills | Status: DC

## 2021-05-08 NOTE — Progress Notes (Signed)
[facility] [place], [place] [address]  Internal MEDICINE  Office Visit Note  Patient Name: Carlos Fischer  [id number]  [id number]  Date of Service: 05/08/2021  Chief Complaint  Patient presents with   Follow-up    Urinary frequency    Hyperlipidemia   Hypertension    HPI Carlos Fischer presents for a follow up visit for hypertension, vascular dementia and enlarged prostate. He is accompanied by his grandson to help with translation/interpretation. He recently returned from a 6 month visit to [place]. While he was in [place], he was having more aggressive and agitated behavior so he was seen by a neurologist. His memantine dose was increased to 10 mg twice daily and he was started on 0.5 mg of risperidone. The grandson states that it has been helping.  Was started on risperidone in [place] was seen by neurologist down there.  He is scheduled for a carotid ultrasound at [facility] with Dr. Lucky Cowboy on 12/28.   Has appt in march with [facility] neurology but needs refill of risperidone now.  Also having issue with urinary frequency and sometime incontinence currently taking flomax 0.4 mg daily for enlarged prostate.     Current Medication: Outpatient Encounter Medications as of 05/08/2021  Medication Sig   aspirin EC 81 MG tablet Take 81 mg by mouth daily.   atorvastatin (LIPITOR) 40 MG tablet Take 1 tablet (40 mg total) by mouth daily.   lisinopril (ZESTRIL) 10 MG tablet Take 1 tablet (10 mg total) by mouth daily.   metoprolol succinate (TOPROL-XL) 25 MG 24 hr tablet TAKE 1 TABLET EVERY DAY   risperiDONE (RISPERDAL) 0.5 MG tablet Take 1 tablet (0.5 mg total) by mouth at bedtime.   [DISCONTINUED] memantine (NAMENDA) 5 MG tablet TAKE 1 TABLET TWICE DAILY (Patient taking differently: 2 (two) times daily. 2 tabs in the morning and 1 tab at night)   [DISCONTINUED] pneumococcal 20-valent conjugate vaccine (PREVNAR 20) 0.5 ML injection Inject 0.5 mLs into the muscle  tomorrow at 10 am.   [DISCONTINUED] risperiDONE (RISPERDAL PO) Take 25 mg by mouth daily.   [DISCONTINUED] tamsulosin (FLOMAX) 0.4 MG CAPS capsule Take 1 capsule (0.4 mg total) by mouth daily.   memantine (NAMENDA) 5 MG tablet Take 2 tablets (10 mg total) by mouth 2 (two) times daily. 2 tabs in the morning and 1 tab at night   pneumococcal 20-valent conjugate vaccine (PREVNAR 20) 0.5 ML injection Inject 0.5 mLs into the muscle tomorrow at 10 am for 1 dose.   tamsulosin (FLOMAX) 0.4 MG CAPS capsule Take 1 capsule (0.4 mg total) by mouth daily.   No facility-administered encounter medications on file as of 05/08/2021.    Surgical History: Past Surgical History:  Procedure Laterality Date   CAROTID ENDARTERECTOMY  01/02/2012    Medical History: Past Medical History:  Diagnosis Date   Carotid artery occlusion    Carotid stenosis    Dementia ([place])    Hyperlipidemia    Hypertension    Stroke (Carlos Fischer)    Hemorrhagic stroke    Family History: Family History  Problem Relation Age of Onset   Cancer Mother    Kidney disease Father     Social History   Socioeconomic History   Marital status: Unknown    Spouse name: Not on file   Number of children: Not on file   Years of education: Not on file   Highest education level: Not on file  Occupational History   Not on file  Tobacco  Use   Smoking status: Former   Smokeless tobacco: Never  Scientific laboratory technician Use: Never used  Substance and Sexual Activity   Alcohol use: No   Drug use: No   Sexual activity: Not on file  Other Topics Concern   Not on file  Social History Narrative   Not on file   Social Determinants of Health   Financial Resource Strain: Not on file  Food Insecurity: Not on file  Transportation Needs: Not on file  Physical Activity: Not on file  Stress: Not on file  Social Connections: Not on file  Intimate Partner Violence: Not on file      Review of Systems  Constitutional:  Negative for chills,  fatigue and unexpected weight change.  HENT:  Negative for congestion, rhinorrhea, sneezing and sore throat.   Eyes:  Negative for redness.  Respiratory:  Negative for cough, chest tightness and shortness of breath.   Cardiovascular:  Negative for chest pain and palpitations.  Gastrointestinal:  Negative for abdominal pain, constipation, diarrhea, nausea and vomiting.  Genitourinary:  Positive for frequency. Negative for dysuria.  Musculoskeletal:  Negative for arthralgias, back pain, joint swelling and neck pain.  Skin:  Negative for rash.  Neurological: Negative.  Negative for tremors and numbness.  Hematological:  Negative for adenopathy. Does not bruise/bleed easily.  Psychiatric/Behavioral:  Positive for agitation and confusion. Negative for behavioral problems (Depression), self-injury, sleep disturbance and suicidal ideas. The patient is not nervous/anxious.    Vital Signs: BP 138/80    Pulse 70    Temp 98.6 F (37 C)    Resp 16    Ht 5\' 4"  (1.626 m)    Wt 136 lb (61.7 kg)    SpO2 98%    BMI 23.34 kg/m    Physical Exam Constitutional:      General: He is not in acute distress.    Appearance: Normal appearance. He is normal weight. He is not ill-appearing.  HENT:     Head: Normocephalic and atraumatic.  Eyes:     Pupils: Pupils are equal, round, and reactive to light.  Cardiovascular:     Rate and Rhythm: Normal rate and regular rhythm.  Pulmonary:     Effort: Pulmonary effort is normal. No respiratory distress.  Neurological:     Mental Status: He is alert. Mental status is at baseline. He is disoriented.     Cranial Nerves: No cranial nerve deficit.     Coordination: Coordination normal.     Gait: Gait normal.  Psychiatric:        Mood and Affect: Mood normal.        Behavior: Behavior normal.       Assessment/Plan: 1. Prostate enlargement Tamsulosin dose increased. If this does not help will try oxybutynin or something similar.  - tamsulosin (FLOMAX) 0.4 MG  CAPS capsule; Take 1 capsule (0.4 mg total) by mouth daily.  Dispense: 60 capsule; Refill: 5  2. Moderate vascular dementia with agitation Medications that were changed and added while in [place] were updated today on his medication list. New prescriptions were sent in. He hsa an appointment with [facility] neurology in march 2023.  - memantine (NAMENDA) 5 MG tablet; Take 2 tablets (10 mg total) by mouth 2 (two) times daily. 2 tabs in the morning and 1 tab at night  Dispense: 180 tablet; Refill: 1 - risperiDONE (RISPERDAL) 0.5 MG tablet; Take 1 tablet (0.5 mg total) by mouth at bedtime.  Dispense: 90 tablet; Refill: 1  3. Dysuria Urinalysis was normal - POCT Urinalysis Dipstick  4. Need for vaccination - pneumococcal 20-valent conjugate vaccine (PREVNAR 20) 0.5 ML injection; Inject 0.5 mLs into the muscle tomorrow at 10 am for 1 dose.  Dispense: 0.5 mL; Refill: 0   General Counseling: [name] verbalizes understanding of the findings of todays visit and agrees with plan of treatment. I have discussed any further diagnostic evaluation that may be needed or ordered today. We also reviewed his medications today. he has been encouraged to call the office with any questions or concerns that should arise related to todays visit.    Orders Placed This Encounter  Procedures   POCT Urinalysis Dipstick    Meds ordered this encounter  Medications   pneumococcal 20-valent conjugate vaccine (PREVNAR 20) 0.5 ML injection    Sig: Inject 0.5 mLs into the muscle tomorrow at 10 am for 1 dose.    Dispense:  0.5 mL    Refill:  0   tamsulosin (FLOMAX) 0.4 MG CAPS capsule    Sig: Take 1 capsule (0.4 mg total) by mouth daily.    Dispense:  60 capsule    Refill:  5   memantine (NAMENDA) 5 MG tablet    Sig: Take 2 tablets (10 mg total) by mouth 2 (two) times daily. 2 tabs in the morning and 1 tab at night    Dispense:  180 tablet    Refill:  1   risperiDONE (RISPERDAL) 0.5 MG tablet    Sig: Take 1 tablet (0.5  mg total) by mouth at bedtime.    Dispense:  90 tablet    Refill:  1    Seen by neurologist in [place] and was started on risperidone 0.5 mg    Return in 5 weeks (on 06/13/2021) for previously scheduled, CPE, [place] PCP.   Total time spent:30 Minutes Time spent includes review of chart, medications, test results, and follow up plan with the patient.   [place] Controlled Substance Database was reviewed by me.  This patient was seen by Jonetta Osgood, FNP-C in collaboration with Dr. Clayborn Bigness as a part of collaborative care agreement.   Rashaan Wyles R. Valetta Fuller, MSN, FNP-C Internal medicine

## 2021-05-09 MED ORDER — MEMANTINE HCL 5 MG PO TABS: 10.0000 mg | ORAL_TABLET | Freq: Two times a day (BID) | ORAL | 1 refills | Status: DC

## 2021-05-09 NOTE — Addendum Note (Signed)
Addended by: Jonetta Osgood on: 05/09/2021 01:32 PM   Modules accepted: Orders

## 2021-05-11 ENCOUNTER — Telehealth: Payer: Self-pay

## 2021-05-15 ENCOUNTER — Encounter (INDEPENDENT_AMBULATORY_CARE_PROVIDER_SITE_OTHER): Payer: Self-pay | Admitting: Vascular Surgery

## 2021-05-15 ENCOUNTER — Ambulatory Visit (INDEPENDENT_AMBULATORY_CARE_PROVIDER_SITE_OTHER): Payer: Medicare HMO

## 2021-05-15 ENCOUNTER — Other Ambulatory Visit (INDEPENDENT_AMBULATORY_CARE_PROVIDER_SITE_OTHER): Payer: Self-pay | Admitting: Vascular Surgery

## 2021-05-15 ENCOUNTER — Ambulatory Visit (INDEPENDENT_AMBULATORY_CARE_PROVIDER_SITE_OTHER): Payer: Medicare HMO | Admitting: Vascular Surgery

## 2021-05-15 ENCOUNTER — Other Ambulatory Visit: Payer: Self-pay

## 2021-05-15 VITALS — BP 132/76 | HR 62 | Resp 16 | Wt 139.0 lb

## 2021-05-15 DIAGNOSIS — I6523 Occlusion and stenosis of bilateral carotid arteries: Secondary | ICD-10-CM

## 2021-05-15 DIAGNOSIS — I10 Essential (primary) hypertension: Secondary | ICD-10-CM

## 2021-05-15 DIAGNOSIS — E785 Hyperlipidemia, unspecified: Secondary | ICD-10-CM

## 2021-05-15 NOTE — Assessment & Plan Note (Signed)
Duplex today shows a patent right carotid endarterectomy and 1 to 39% left ICA stenosis without significant progression from previous study.  His dementia has progressed, but not from large vessel carotid disease.  No role for intervention at this time.  Recheck in 1 year.  Continue current medical regimen.

## 2021-05-15 NOTE — Progress Notes (Signed)
MRN : [id number]  Carlos Fischer is a 70 y.o. ([birth year]) male who presents with chief complaint of  Chief Complaint  Patient presents with   Follow-up    Ultrasound follow up  .  History of Present Illness: Patient returns in follow-up of his carotid disease.  He is almost a decade status post right carotid endarterectomy.  He has had progression of his dementia and his son provides all the history today.  Apparently has had more behavioral disturbances and they had to change his medications.  No focal neurologic symptoms. Specifically, the patient denies amaurosis fugax, speech or swallowing difficulties, or arm or leg weakness or numbness. Duplex today shows a patent right carotid endarterectomy and 1 to 39% left ICA stenosis without significant progression from previous study.  Current Outpatient Medications  Medication Sig Dispense Refill   aspirin EC 81 MG tablet Take 81 mg by mouth daily.     atorvastatin (LIPITOR) 40 MG tablet Take 1 tablet (40 mg total) by mouth daily. 90 tablet 3   lisinopril (ZESTRIL) 10 MG tablet Take 1 tablet (10 mg total) by mouth daily. 90 tablet 3   memantine (NAMENDA) 5 MG tablet Take 2 tablets (10 mg total) by mouth 2 (two) times daily. 180 tablet 1   metoprolol succinate (TOPROL-XL) 25 MG 24 hr tablet TAKE 1 TABLET EVERY DAY 90 tablet 1   risperiDONE (RISPERDAL) 0.5 MG tablet Take 1 tablet (0.5 mg total) by mouth at bedtime. 90 tablet 1   tamsulosin (FLOMAX) 0.4 MG CAPS capsule Take 2 capsules (0.8 mg total) by mouth daily. 180 capsule 1   No current facility-administered medications for this visit.    Past Medical History:  Diagnosis Date   Carotid artery occlusion    Carotid stenosis    Dementia ([facility])    Hyperlipidemia    Hypertension    Stroke [facility])    Hemorrhagic stroke    Past Surgical History:  Procedure Laterality Date   CAROTID ENDARTERECTOMY  01/02/2012     Social History   Tobacco Use   Smoking status:  Former   Smokeless tobacco: Never  Scientific laboratory technician Use: Never used  Substance Use Topics   Alcohol use: No   Drug use: No      Family History  Problem Relation Age of Onset   Cancer Mother    Kidney disease Father   No bleeding or clotting disorders  No Known Allergies   REVIEW OF SYSTEMS (Negative unless checked)  Constitutional: [] Weight loss  [] Fever  [] Chills Cardiac: [] Chest pain   [] Chest pressure   [] Palpitations   [] Shortness of breath when laying flat   [] Shortness of breath at rest   [] Shortness of breath with exertion. Vascular:  [] Pain in legs with walking   [] Pain in legs at rest   [] Pain in legs when laying flat   [] Claudication   [] Pain in feet when walking  [] Pain in feet at rest  [] Pain in feet when laying flat   [] History of DVT   [] Phlebitis   [] Swelling in legs   [] Varicose veins   [] Non-healing ulcers Pulmonary:   [] Uses home oxygen   [] Productive cough   [] Hemoptysis   [] Wheeze  [] COPD   [] Asthma Neurologic:  [] Dizziness  [] Blackouts   [] Seizures   [] History of stroke   [] History of TIA  [] Aphasia   [] Temporary blindness   [] Dysphagia   [] Weakness or numbness in arms   [] Weakness or numbness in legs  Musculoskeletal:  [x] Arthritis   [] Joint swelling   [x] Joint pain   [] Low back pain Hematologic:  [] Easy bruising  [] Easy bleeding   [] Hypercoagulable state   [] Anemic  [] Hepatitis Gastrointestinal:  [] Blood in stool   [] Vomiting blood  [x] Gastroesophageal reflux/heartburn   [] Difficulty swallowing. Genitourinary:  [] Chronic kidney disease   [] Difficult urination  [] Frequent urination  [] Burning with urination   [] Blood in urine Skin:  [] Rashes   [] Ulcers   [] Wounds Psychological:  [] History of anxiety   []  History of major depression.  Physical Examination  Vitals:   05/15/21 0959  BP: 132/76  Pulse: 62  Resp: 16  Weight: 139 lb (63 kg)   Body mass index is 23.86 kg/m. Gen:  WD/WN, NAD Head: [place]/AT, No temporalis wasting. Ear/Nose/Throat: Hearing  grossly intact, nares w/o erythema or drainage, trachea midline Eyes: Conjunctiva clear. Sclera non-icteric Neck: Supple.  No bruit  Pulmonary:  Good air movement, equal and clear to auscultation bilaterally.  Cardiac: RRR, No JVD Vascular:  Vessel Right Left  Radial Palpable Palpable       Musculoskeletal: M/S 5/5 throughout.  No deformity or atrophy. No edema. Neurologic: CN 2-12 intact. Sensation grossly intact in extremities.  Symmetrical.  Speech is fluent. Motor exam as listed above. Psychiatric: Judgment and insight are poor.  Son provides history Dermatologic: No rashes or ulcers noted.  No cellulitis or open wounds.   CBC Lab Results  Component Value Date   WBC 10.2 04/27/2020   HGB 17.0 04/27/2020   HCT 50.1 04/27/2020   MCV 95 04/27/2020   PLT 228 04/27/2020    BMET    Component Value Date/Time   NA 140 04/27/2020 0929   NA 139 01/03/2012 0604   K 4.6 04/27/2020 0929   K 3.6 01/03/2012 0604   CL 100 04/27/2020 0929   CL 106 01/03/2012 0604   CO2 26 04/27/2020 0929   CO2 26 01/03/2012 0604   GLUCOSE 119 (H) 04/27/2020 0929   GLUCOSE 114 (H) 01/03/2012 0604   BUN 10 04/27/2020 0929   BUN 5 (L) 01/03/2012 0604   CREATININE 0.71 (L) 04/27/2020 0929   CREATININE 0.61 01/03/2012 0604   CALCIUM 9.5 04/27/2020 0929   CALCIUM 8.5 01/03/2012 0604   GFRNONAA 94 04/27/2020 0929   GFRNONAA >60 01/03/2012 0604   GFRAA 109 04/27/2020 0929   GFRAA >60 01/03/2012 0604   CrCl cannot be calculated (Patient's most recent lab result is older than the maximum 21 days allowed.).  COAG Lab Results  Component Value Date   INR 1.0 04/07/2012   INR 1.0 01/03/2012    Radiology No results found.   Assessment/Plan Hyperlipidemia lipid control important in reducing the progression of atherosclerotic disease. Continue statin therapy     Essential hypertension blood pressure control important in reducing the progression of atherosclerotic disease. On appropriate oral  medications.  Bilateral carotid artery stenosis Duplex today shows a patent right carotid endarterectomy and 1 to 39% left ICA stenosis without significant progression from previous study.  His dementia has progressed, but not from large vessel carotid disease.  No role for intervention at this time.  Recheck in 1 year.  Continue current medical regimen.    Leotis Pain, MD  05/15/2021 10:41 AM    This note was created with Dragon medical transcription system.  Any errors from dictation are purely unintentional

## 2021-05-16 ENCOUNTER — Telehealth: Payer: Self-pay

## 2021-05-16 NOTE — Telephone Encounter (Signed)
LMOM for Carlos Fischer to return my call to see if pt needed a PA for medications RISPERIDONE and MEMANTINE.

## 2021-05-17 NOTE — Telephone Encounter (Signed)
error 

## 2021-05-22 ENCOUNTER — Other Ambulatory Visit: Payer: Self-pay

## 2021-05-22 ENCOUNTER — Telehealth: Payer: Self-pay

## 2021-05-22 DIAGNOSIS — F01B11 Vascular dementia, moderate, with agitation: Secondary | ICD-10-CM

## 2021-05-22 MED ORDER — MEMANTINE HCL 5 MG PO TABS: 10.0000 mg | ORAL_TABLET | Freq: Two times a day (BID) | ORAL | 1 refills | Status: DC

## 2021-05-22 MED ORDER — RISPERIDONE 0.5 MG PO TABS: 0.5000 mg | ORAL_TABLET | Freq: Every day | ORAL | 1 refills | Status: DC

## 2021-05-22 NOTE — Telephone Encounter (Signed)
PA for MEMANTINE 5 MG was approved PACASE# [id number] valid 05/20/21 to 05/19/22  PA for RISPERIDONE 0.5MG  TABLETS was approved 05/22/21 and is valid until 05/19/22  Called and [facility] for Carlos Fischer and informed him that both were approved and sent new rx to [facility] mail order

## 2021-05-28 ENCOUNTER — Telehealth: Payer: Self-pay

## 2021-05-28 DIAGNOSIS — F01B11 Vascular dementia, moderate, with agitation: Secondary | ICD-10-CM

## 2021-05-28 DIAGNOSIS — N400 Enlarged prostate without lower urinary tract symptoms: Secondary | ICD-10-CM

## 2021-05-28 MED ORDER — TAMSULOSIN HCL 0.4 MG PO CAPS: 0.8000 mg | ORAL_CAPSULE | Freq: Every day | ORAL | 1 refills | Status: DC

## 2021-05-28 MED ORDER — RISPERIDONE 0.5 MG PO TABS: 0.5000 mg | ORAL_TABLET | Freq: Every day | ORAL | 1 refills | Status: DC

## 2021-05-28 NOTE — Telephone Encounter (Signed)
error 

## 2021-06-05 ENCOUNTER — Other Ambulatory Visit: Payer: Self-pay | Admitting: Internal Medicine

## 2021-06-05 DIAGNOSIS — N400 Enlarged prostate without lower urinary tract symptoms: Secondary | ICD-10-CM

## 2021-06-06 ENCOUNTER — Other Ambulatory Visit: Payer: Self-pay

## 2021-06-06 DIAGNOSIS — E782 Mixed hyperlipidemia: Secondary | ICD-10-CM

## 2021-06-06 DIAGNOSIS — R5383 Other fatigue: Secondary | ICD-10-CM | POA: Diagnosis not present

## 2021-06-06 DIAGNOSIS — E038 Other specified hypothyroidism: Secondary | ICD-10-CM

## 2021-06-06 DIAGNOSIS — E559 Vitamin D deficiency, unspecified: Secondary | ICD-10-CM | POA: Diagnosis not present

## 2021-06-07 LAB — CBC WITH DIFFERENTIAL/PLATELET
Basophils Absolute: 0.1 10*3/uL (ref 0.0–0.2)
Basos: 1 %
EOS (ABSOLUTE): 1.7 10*3/uL — ABNORMAL HIGH (ref 0.0–0.4)
Eos: 14 %
Hematocrit: 45.2 % (ref 37.5–51.0)
Hemoglobin: 14.9 g/dL (ref 13.0–17.7)
Immature Grans (Abs): 0 10*3/uL (ref 0.0–0.1)
Immature Granulocytes: 0 %
Lymphocytes Absolute: 2.6 10*3/uL (ref 0.7–3.1)
Lymphs: 21 %
MCH: 31.2 pg (ref 26.6–33.0)
MCHC: 33 g/dL (ref 31.5–35.7)
MCV: 95 fL (ref 79–97)
Monocytes Absolute: 0.5 10*3/uL (ref 0.1–0.9)
Monocytes: 4 %
Neutrophils Absolute: 7.5 10*3/uL — ABNORMAL HIGH (ref 1.4–7.0)
Neutrophils: 60 %
Platelets: 207 10*3/uL (ref 150–450)
RBC: 4.78 x10E6/uL (ref 4.14–5.80)
RDW: 12.5 % (ref 11.6–15.4)
WBC: 12.4 10*3/uL — ABNORMAL HIGH (ref 3.4–10.8)

## 2021-06-07 LAB — LIPID PANEL
Chol/HDL Ratio: 2.7 ratio (ref 0.0–5.0)
Cholesterol, Total: 135 mg/dL (ref 100–199)
HDL: 50 mg/dL (ref >39)
LDL Chol Calc (NIH): 68 mg/dL (ref 0–99)
Triglycerides: 87 mg/dL (ref 0–149)
VLDL Cholesterol Cal: 17 mg/dL (ref 5–40)

## 2021-06-07 LAB — CMP14+EGFR
ALT: 45 IU/L — ABNORMAL HIGH (ref 0–44)
AST: 26 IU/L (ref 0–40)
Albumin/Globulin Ratio: 1.5 (ref 1.2–2.2)
Albumin: 4.6 g/dL (ref 3.7–4.7)
Alkaline Phosphatase: 124 IU/L — ABNORMAL HIGH (ref 44–121)
BUN/Creatinine Ratio: 16 (ref 10–24)
BUN: 12 mg/dL (ref 8–27)
Bilirubin Total: 0.7 mg/dL (ref 0.0–1.2)
CO2: 23 mmol/L (ref 20–29)
Calcium: 9.3 mg/dL (ref 8.6–10.2)
Chloride: 101 mmol/L (ref 96–106)
Creatinine, Ser: 0.75 mg/dL — ABNORMAL LOW (ref 0.76–1.27)
Globulin, Total: 3 g/dL (ref 1.5–4.5)
Glucose: 108 mg/dL — ABNORMAL HIGH (ref 70–99)
Potassium: 4 mmol/L (ref 3.5–5.2)
Sodium: 140 mmol/L (ref 134–144)
Total Protein: 7.6 g/dL (ref 6.0–8.5)
eGFR: 96 mL/min/{1.73_m2} (ref >59)

## 2021-06-07 LAB — TSH+FREE T4
Free T4: 1.2 ng/dL (ref 0.82–1.77)
TSH: 2.99 u[IU]/mL (ref 0.450–4.500)

## 2021-06-07 LAB — VITAMIN D 25 HYDROXY (VIT D DEFICIENCY, FRACTURES): Vit D, 25-Hydroxy: 18.3 ng/mL — ABNORMAL LOW (ref 30.0–100.0)

## 2021-06-11 DIAGNOSIS — I69954 Hemiplegia and hemiparesis following unspecified cerebrovascular disease affecting left non-dominant side: Secondary | ICD-10-CM | POA: Diagnosis not present

## 2021-06-11 DIAGNOSIS — R531 Weakness: Secondary | ICD-10-CM | POA: Diagnosis not present

## 2021-06-11 DIAGNOSIS — Z7982 Long term (current) use of aspirin: Secondary | ICD-10-CM | POA: Diagnosis not present

## 2021-06-11 DIAGNOSIS — M6281 Muscle weakness (generalized): Secondary | ICD-10-CM | POA: Diagnosis not present

## 2021-06-11 DIAGNOSIS — E7800 Pure hypercholesterolemia, unspecified: Secondary | ICD-10-CM | POA: Diagnosis not present

## 2021-06-11 DIAGNOSIS — I6782 Cerebral ischemia: Secondary | ICD-10-CM | POA: Diagnosis not present

## 2021-06-11 DIAGNOSIS — Z79899 Other long term (current) drug therapy: Secondary | ICD-10-CM | POA: Diagnosis not present

## 2021-06-11 DIAGNOSIS — I10 Essential (primary) hypertension: Secondary | ICD-10-CM | POA: Diagnosis not present

## 2021-06-11 DIAGNOSIS — F03C0 Unspecified dementia, severe, without behavioral disturbance, psychotic disturbance, mood disturbance, and anxiety: Secondary | ICD-10-CM | POA: Diagnosis not present

## 2021-06-11 NOTE — Progress Notes (Signed)
I have reviewed the lab results. There are no critically abnormal values requiring immediate intervention but there are some abnormals that will be discussed at the next office visit.  

## 2021-06-13 ENCOUNTER — Encounter (INDEPENDENT_AMBULATORY_CARE_PROVIDER_SITE_OTHER): Payer: Self-pay

## 2021-06-13 ENCOUNTER — Encounter: Payer: Self-pay | Admitting: Nurse Practitioner

## 2021-06-13 ENCOUNTER — Ambulatory Visit: Payer: Medicare HMO | Admitting: Nurse Practitioner

## 2021-06-13 ENCOUNTER — Other Ambulatory Visit: Payer: Self-pay

## 2021-06-13 VITALS — BP 98/60 | HR 75 | Temp 98.4°F | Resp 16 | Ht 63.0 in | Wt 137.0 lb

## 2021-06-13 DIAGNOSIS — M1611 Unilateral primary osteoarthritis, right hip: Secondary | ICD-10-CM

## 2021-06-13 DIAGNOSIS — E559 Vitamin D deficiency, unspecified: Secondary | ICD-10-CM

## 2021-06-13 DIAGNOSIS — F01B11 Vascular dementia, moderate, with agitation: Secondary | ICD-10-CM | POA: Diagnosis not present

## 2021-06-13 DIAGNOSIS — Z0001 Encounter for general adult medical examination with abnormal findings: Secondary | ICD-10-CM

## 2021-06-13 DIAGNOSIS — R300 Dysuria: Secondary | ICD-10-CM

## 2021-06-13 DIAGNOSIS — Z23 Encounter for immunization: Secondary | ICD-10-CM | POA: Diagnosis not present

## 2021-06-13 MED ORDER — TETANUS-DIPHTH-ACELL PERTUSSIS 5-2.5-18.5 LF-MCG/0.5 IM SUSP: 0.5000 mL | Freq: Once | INTRAMUSCULAR | 0 refills | Status: AC

## 2021-06-13 MED ORDER — MELOXICAM 7.5 MG PO TABS: 7.5000 mg | ORAL_TABLET | Freq: Two times a day (BID) | ORAL | 2 refills | Status: DC

## 2021-06-13 MED ORDER — VITAMIN D (ERGOCALCIFEROL) 1.25 MG (50000 UNIT) PO CAPS: 50000.0000 [IU] | ORAL_CAPSULE | ORAL | 3 refills | Status: DC

## 2021-06-13 MED ORDER — PNEUMOCOCCAL 20-VAL CONJ VACC 0.5 ML IM SUSY: 0.5000 mL | PREFILLED_SYRINGE | INTRAMUSCULAR | 0 refills | Status: AC

## 2021-06-13 MED ORDER — ALPRAZOLAM 0.25 MG PO TABS: 0.2500 mg | ORAL_TABLET | Freq: Two times a day (BID) | ORAL | 1 refills | Status: DC | PRN

## 2021-06-13 MED ORDER — ZOSTER VAC RECOMB ADJUVANTED 50 MCG/0.5ML IM SUSR: 0.5000 mL | Freq: Once | INTRAMUSCULAR | 0 refills | Status: AC

## 2021-06-13 NOTE — Progress Notes (Signed)
[facility] [place], [place] [address]  Internal MEDICINE  Office Visit Note  Patient Name: Carlos Fischer  [id number]  [id number]  Date of Service: 06/13/2021  Chief Complaint  Patient presents with   Medicare Wellness    Starting to lean more than normal, stroke was ruled out at [place] presents for an annual well visit and physical exam. He is a well appearing 71 yo male with vascular dementia. He is accompanied by his son to aid in translation and care. His dementia has been getting progressively worse over the past year with increased aggression especially in the evening times and increased weakness. Today, he is leaning toward his right side and has been favoring that side. His hip is hurting and he is constantly moving from sitting to standing because he cannot get into a comfortable position. He has not taken anything other than tylenol for pain relief.  He has labs drawn prior to his office visit today. Labs reviewed with patient and son. His vitamin D is significantly low at 18.6. His alkaline phosphatase and AST are elevated. His CBC, metabolic panel, thyroid levels and lipid panel are grossly normal.  He has an appointment at the end of march with the neurologist. Takes 10 mg of namenda twice daily and risperidone 0.5 mg at bedtime daily.  He went to [facility] and stroke was ruled out but he had abnormal findings on imaging that indicated prior mini strokes.      Current Medication: Outpatient Encounter Medications as of 06/13/2021  Medication Sig   ALPRAZolam (XANAX) 0.25 MG tablet Take 1 tablet (0.25 mg total) by mouth 2 (two) times daily as needed for anxiety (or agitation).   aspirin EC 81 MG tablet Take 81 mg by mouth daily.   atorvastatin (LIPITOR) 40 MG tablet Take 1 tablet (40 mg total) by mouth daily.   lisinopril (ZESTRIL) 10 MG tablet Take 1 tablet (10 mg total) by mouth daily.   meloxicam (MOBIC) 7.5 MG tablet  Take 1 tablet (7.5 mg total) by mouth in the morning and at bedtime.   memantine (NAMENDA) 5 MG tablet Take 2 tablets (10 mg total) by mouth 2 (two) times daily.   metoprolol succinate (TOPROL-XL) 25 MG 24 hr tablet TAKE 1 TABLET EVERY DAY   risperiDONE (RISPERDAL) 0.5 MG tablet Take 1 tablet (0.5 mg total) by mouth at bedtime.   tamsulosin (FLOMAX) 0.4 MG CAPS capsule Take 2 capsules (0.8 mg total) by mouth daily. (Patient taking differently: Take 0.8 mg by mouth daily. Pt is taking 1 capsule daily)   Vitamin D, Ergocalciferol, (DRISDOL) 1.25 MG (50000 UNIT) CAPS capsule Take 1 capsule (50,000 Units total) by mouth every 7 (seven) days.   [DISCONTINUED] pneumococcal 20-valent conjugate vaccine (PREVNAR 20) 0.5 ML injection Inject 0.5 mLs into the muscle tomorrow at 10 am.   [DISCONTINUED] Tdap (BOOSTRIX) 5-2.5-18.5 LF-MCG/0.5 injection Inject 0.5 mLs into the muscle once.   [DISCONTINUED] Zoster Vaccine Adjuvanted [facility]) injection Inject 0.5 mLs into the muscle once.   pneumococcal 20-valent conjugate vaccine (PREVNAR 20) 0.5 ML injection Inject 0.5 mLs into the muscle tomorrow at 10 am for 1 dose.   Tdap (BOOSTRIX) 5-2.5-18.5 LF-MCG/0.5 injection Inject 0.5 mLs into the muscle once for 1 dose.   Zoster Vaccine Adjuvanted [facility]) injection Inject 0.5 mLs into the muscle once for 1 dose.   No facility-administered encounter medications on file as of 06/13/2021.    Surgical History: Past Surgical  History:  Procedure Laterality Date   CAROTID ENDARTERECTOMY  01/02/2012    Medical History: Past Medical History:  Diagnosis Date   Carotid artery occlusion    Carotid stenosis    Dementia ([place])    Hyperlipidemia    Hypertension    Stroke ([place])    Hemorrhagic stroke    Family History: Family History  Problem Relation Age of Onset   Cancer Mother    Kidney disease Father     Social History   Socioeconomic History   Marital status: Unknown    Spouse name: Not on file    Number of children: Not on file   Years of education: Not on file   Highest education level: Not on file  Occupational History   Not on file  Tobacco Use   Smoking status: Former   Smokeless tobacco: Never  Vaping Use   Vaping Use: Never used  Substance and Sexual Activity   Alcohol use: No   Drug use: No   Sexual activity: Not on file  Other Topics Concern   Not on file  Social History Narrative   Not on file   Social Determinants of Health   Financial Resource Strain: Not on file  Food Insecurity: Not on file  Transportation Needs: Not on file  Physical Activity: Not on file  Stress: Not on file  Social Connections: Not on file  Intimate Partner Violence: Not on file      Review of Systems  Constitutional:  Negative for activity change, appetite change, chills, fatigue, fever and unexpected weight change.  HENT: Negative.  Negative for congestion, ear pain, rhinorrhea, sore throat and trouble swallowing.   Eyes: Negative.   Respiratory: Negative.  Negative for cough, chest tightness, shortness of breath and wheezing.   Cardiovascular: Negative.  Negative for chest pain and palpitations.  Gastrointestinal: Negative.  Negative for abdominal pain, blood in stool, constipation, diarrhea, nausea and vomiting.  Endocrine: Negative.   Genitourinary:  Positive for frequency and urgency. Negative for difficulty urinating, dysuria and hematuria.       Incontinence  Musculoskeletal:  Positive for arthralgias and gait problem. Negative for back pain, joint swelling, myalgias and neck pain.       Hip pain  Skin: Negative.  Negative for rash and wound.  Allergic/Immunologic: Negative.  Negative for immunocompromised state.  Neurological:  Positive for weakness. Negative for dizziness, seizures, numbness and headaches.  Hematological: Negative.   Psychiatric/Behavioral:  Positive for agitation and sleep disturbance. Negative for behavioral problems, self-injury and suicidal ideas.  The patient is nervous/anxious.    Vital Signs: BP 98/60 (BP Location: Left Arm, Patient Position: Sitting, Cuff Size: Normal)    Pulse 75    Temp 98.4 F (36.9 C)    Resp 16    Ht 5\' 3"  (1.6 m)    Wt 137 lb (62.1 kg)    SpO2 96%    BMI 24.27 kg/m    Physical Exam Vitals reviewed.  Constitutional:      General: He is awake. He is not in acute distress.    Appearance: Normal appearance. He is well-developed, well-groomed and normal weight. He is not ill-appearing or diaphoretic.  HENT:     Head: Normocephalic and atraumatic.     Right Ear: Tympanic membrane, ear canal and external ear normal.     Left Ear: Tympanic membrane, ear canal and external ear normal.     Nose: Nose normal. No congestion or rhinorrhea.     Mouth/Throat:  Lips: Pink.     Mouth: Mucous membranes are moist.     Pharynx: Oropharynx is clear. Uvula midline. No oropharyngeal exudate or posterior oropharyngeal erythema.  Eyes:     General: Lids are normal. Vision grossly intact. Gaze aligned appropriately. No scleral icterus.       Right eye: No discharge.        Left eye: No discharge.     Extraocular Movements: Extraocular movements intact.     Conjunctiva/sclera: Conjunctivae normal.     Pupils: Pupils are equal, round, and reactive to light.     Funduscopic exam:    Right eye: Red reflex present.        Left eye: Red reflex present. Neck:     Thyroid: No thyromegaly.     Vascular: No carotid bruit or JVD.     Trachea: Trachea and phonation normal. No tracheal deviation.  Cardiovascular:     Rate and Rhythm: Normal rate and regular rhythm.     Pulses: Normal pulses.     Heart sounds: Normal heart sounds, S1 normal and S2 normal. No murmur heard.   No friction rub. No gallop.  Pulmonary:     Effort: Pulmonary effort is normal. No accessory muscle usage or respiratory distress.     Breath sounds: Normal breath sounds and air entry. No stridor. No wheezing or rales.  Chest:     Chest wall: No  tenderness.  Abdominal:     General: Bowel sounds are normal. There is no distension.     Palpations: Abdomen is soft. There is no mass.     Tenderness: There is no abdominal tenderness. There is no guarding or rebound.  Musculoskeletal:        General: No tenderness or deformity. Normal range of motion.     Cervical back: Normal range of motion and neck supple.     Right lower leg: No edema.     Left lower leg: No edema.  Lymphadenopathy:     Cervical: No cervical adenopathy.  Skin:    General: Skin is warm and dry.     Capillary Refill: Capillary refill takes less than 2 seconds.     Coloration: Skin is not pale.     Findings: No erythema or rash.  Neurological:     Mental Status: He is alert. He is disoriented and confused.     Cranial Nerves: No cranial nerve deficit.     Motor: Weakness present. No abnormal muscle tone.     Coordination: Coordination abnormal.     Gait: Gait abnormal.     Deep Tendon Reflexes: Reflexes are normal and symmetric.  Psychiatric:        Mood and Affect: Mood normal. Affect is blunt.        Behavior: Behavior is agitated. Behavior is cooperative.        Thought Content: Thought content normal.        Cognition and Memory: Memory is impaired.        Judgment: Judgment is impulsive.       Assessment/Plan: 1. Encounter for routine adult health examination with abnormal findings Age-appropriate preventive screenings and vaccinations discussed, annual physical exam completed. Routine labs for health maintenance drawn prior to office visit, results discussed today. PHM updated.   2. Primary osteoarthritis of right hip Right hip is bothering his more now, meloxicam prescribed.  - meloxicam (MOBIC) 7.5 MG tablet; Take 1 tablet (7.5 mg total) by mouth in the morning and at bedtime.  Dispense: 60 tablet; Refill: 2  3. Vitamin D deficiency Vitamin D supplement prescribed. - Vitamin D, Ergocalciferol, (DRISDOL) 1.25 MG (50000 UNIT) CAPS capsule; Take  1 capsule (50,000 Units total) by mouth every 7 (seven) days.  Dispense: 5 capsule; Refill: 3  4. Dysuria Routine urinalysis done - UA/M w/rflx Culture, Routine  5. Need for vaccination - Zoster Vaccine Adjuvanted [facility]) injection; Inject 0.5 mLs into the muscle once for 1 dose.  Dispense: 0.5 mL; Refill: 0 - Tdap (BOOSTRIX) 5-2.5-18.5 LF-MCG/0.5 injection; Inject 0.5 mLs into the muscle once for 1 dose.  Dispense: 0.5 mL; Refill: 0 - pneumococcal 20-valent conjugate vaccine (PREVNAR 20) 0.5 ML injection; Inject 0.5 mLs into the muscle tomorrow at 10 am for 1 dose.  Dispense: 0.5 mL; Refill: 0  6. Moderate vascular dementia with agitation Alprazolam prescribed for the patient as needed when he is feeling anxious or agitated.  - ALPRAZolam (XANAX) 0.25 MG tablet; Take 1 tablet (0.25 mg total) by mouth 2 (two) times daily as needed for anxiety (or agitation).  Dispense: 60 tablet; Refill: 1      General Counseling: Carlos Fischer verbalizes understanding of the findings of todays visit and agrees with plan of treatment. I have discussed any further diagnostic evaluation that may be needed or ordered today. We also reviewed his medications today. he has been encouraged to call the office with any questions or concerns that should arise related to todays visit.    Orders Placed This Encounter  Procedures   UA/M w/rflx Culture, Routine    Meds ordered this encounter  Medications   Zoster Vaccine Adjuvanted [facility]) injection    Sig: Inject 0.5 mLs into the muscle once for 1 dose.    Dispense:  0.5 mL    Refill:  0   Tdap (BOOSTRIX) 5-2.5-18.5 LF-MCG/0.5 injection    Sig: Inject 0.5 mLs into the muscle once for 1 dose.    Dispense:  0.5 mL    Refill:  0   pneumococcal 20-valent conjugate vaccine (PREVNAR 20) 0.5 ML injection    Sig: Inject 0.5 mLs into the muscle tomorrow at 10 am for 1 dose.    Dispense:  0.5 mL    Refill:  0   meloxicam (MOBIC) 7.5 MG tablet    Sig: Take 1  tablet (7.5 mg total) by mouth in the morning and at bedtime.    Dispense:  60 tablet    Refill:  2   Vitamin D, Ergocalciferol, (DRISDOL) 1.25 MG (50000 UNIT) CAPS capsule    Sig: Take 1 capsule (50,000 Units total) by mouth every 7 (seven) days.    Dispense:  5 capsule    Refill:  3   ALPRAZolam (XANAX) 0.25 MG tablet    Sig: Take 1 tablet (0.25 mg total) by mouth 2 (two) times daily as needed for anxiety (or agitation).    Dispense:  60 tablet    Refill:  1    Return in about 1 month (around 07/14/2021) for F/U, Carlos Fischer PCP.   Total time spent:30 Minutes Time spent includes review of chart, medications, test results, and follow up plan with the patient.   [place] Controlled Substance Database was reviewed by me.  This patient was seen by Jonetta Osgood, FNP-C in collaboration with Dr. Clayborn Bigness as a part of collaborative care agreement.  Tylisa Alcivar R. Valetta Fuller, MSN, FNP-C Internal medicine

## 2021-06-14 LAB — MICROSCOPIC EXAMINATION
Bacteria, UA: NONE SEEN
Casts: NONE SEEN /lpf
Epithelial Cells (non renal): NONE SEEN /hpf (ref 0–10)
WBC, UA: NONE SEEN /hpf (ref 0–5)

## 2021-06-14 LAB — UA/M W/RFLX CULTURE, ROUTINE
Bilirubin, UA: NEGATIVE
Glucose, UA: NEGATIVE
Ketones, UA: NEGATIVE
Leukocytes,UA: NEGATIVE
Nitrite, UA: NEGATIVE
RBC, UA: NEGATIVE
Specific Gravity, UA: >=1.03 — AB (ref 1.005–1.030)
Urobilinogen, Ur: 1 mg/dL (ref 0.2–1.0)
pH, UA: 5.5 (ref 5.0–7.5)

## 2021-06-27 DIAGNOSIS — I680 Cerebral amyloid angiopathy: Secondary | ICD-10-CM | POA: Diagnosis not present

## 2021-06-27 DIAGNOSIS — E854 Organ-limited amyloidosis: Secondary | ICD-10-CM | POA: Diagnosis not present

## 2021-07-10 ENCOUNTER — Other Ambulatory Visit: Payer: Self-pay

## 2021-07-10 ENCOUNTER — Ambulatory Visit (INDEPENDENT_AMBULATORY_CARE_PROVIDER_SITE_OTHER): Payer: Medicare HMO | Admitting: Nurse Practitioner

## 2021-07-10 ENCOUNTER — Encounter: Payer: Self-pay | Admitting: Nurse Practitioner

## 2021-07-10 VITALS — BP 138/78 | HR 64 | Temp 98.1°F | Resp 16 | Ht 63.0 in | Wt 138.0 lb

## 2021-07-10 DIAGNOSIS — F01B11 Vascular dementia, moderate, with agitation: Secondary | ICD-10-CM

## 2021-07-10 DIAGNOSIS — I10 Essential (primary) hypertension: Secondary | ICD-10-CM | POA: Diagnosis not present

## 2021-07-10 DIAGNOSIS — E559 Vitamin D deficiency, unspecified: Secondary | ICD-10-CM | POA: Diagnosis not present

## 2021-07-10 DIAGNOSIS — N400 Enlarged prostate without lower urinary tract symptoms: Secondary | ICD-10-CM | POA: Diagnosis not present

## 2021-07-10 DIAGNOSIS — M1611 Unilateral primary osteoarthritis, right hip: Secondary | ICD-10-CM

## 2021-07-10 MED ORDER — METOPROLOL SUCCINATE ER 25 MG PO TB24: 25.0000 mg | ORAL_TABLET | Freq: Every day | ORAL | 1 refills | Status: DC

## 2021-07-10 MED ORDER — RISPERIDONE 1 MG PO TABS: 1.0000 mg | ORAL_TABLET | Freq: Every day | ORAL | 1 refills | Status: DC

## 2021-07-10 NOTE — Progress Notes (Signed)
[facility] [place], [place] [address]  Internal MEDICINE  Office Visit Note  Patient Name: Carlos Fischer  [id number]  [id number]  Date of Service: 07/10/2021  Chief Complaint  Patient presents with   Follow-up    Discuss meds   Hyperlipidemia   Hypertension    HPI Carlos Fischer presents for a follow up visit for behavior changes associated with dementia, hip pain and arthritis and hypertension. He also was seen by [facility] neurology a couple of weeks ago. Neurology is not recommending any further follows up but did advise that increasing the risperidone dose may help and told that patient's son to ask that patient's PCP. Since his previous office visit, the patient's son states that the patient has improved some and been less agitated. He has not had to take any of the alprazolam that was prescribed for agitation. He is also interested in increasing the risperidone dose as recommended by neurology. The patient was also started on meloxicam for hip pain and his pain has improved since his last office visit. He is sitting comfortably in the chair and not seeming to have any issue with sitting or standing.    Current Medication: Outpatient Encounter Medications as of 07/10/2021  Medication Sig   ALPRAZolam (XANAX) 0.25 MG tablet Take 1 tablet (0.25 mg total) by mouth 2 (two) times daily as needed for anxiety (or agitation).   aspirin EC 81 MG tablet Take 81 mg by mouth daily.   atorvastatin (LIPITOR) 40 MG tablet Take 1 tablet (40 mg total) by mouth daily.   lisinopril (ZESTRIL) 10 MG tablet Take 1 tablet (10 mg total) by mouth daily.   meloxicam (MOBIC) 7.5 MG tablet Take 1 tablet (7.5 mg total) by mouth in the morning and at bedtime.   memantine (NAMENDA) 5 MG tablet Take 2 tablets (10 mg total) by mouth 2 (two) times daily.   risperiDONE (RISPERDAL) 1 MG tablet Take 1 tablet (1 mg total) by mouth at bedtime.   tamsulosin (FLOMAX) 0.4 MG CAPS  capsule Take 2 capsules (0.8 mg total) by mouth daily. (Patient taking differently: Take 0.8 mg by mouth daily. Pt is taking 1 capsule daily)   Vitamin D, Ergocalciferol, (DRISDOL) 1.25 MG (50000 UNIT) CAPS capsule Take 1 capsule (50,000 Units total) by mouth every 7 (seven) days.   [DISCONTINUED] metoprolol succinate (TOPROL-XL) 25 MG 24 hr tablet TAKE 1 TABLET EVERY DAY   [DISCONTINUED] risperiDONE (RISPERDAL) 0.5 MG tablet Take 1 tablet (0.5 mg total) by mouth at bedtime.   metoprolol succinate (TOPROL-XL) 25 MG 24 hr tablet Take 1 tablet (25 mg total) by mouth daily.   No facility-administered encounter medications on file as of 07/10/2021.    Surgical History: Past Surgical History:  Procedure Laterality Date   CAROTID ENDARTERECTOMY  01/02/2012    Medical History: Past Medical History:  Diagnosis Date   Carotid artery occlusion    Carotid stenosis    Dementia ([place])    Hyperlipidemia    Hypertension    Stroke ([place])    Hemorrhagic stroke    Family History: Family History  Problem Relation Age of Onset   Cancer Mother    Kidney disease Father     Social History   Socioeconomic History   Marital status: Unknown    Spouse name: Not on file   Number of children: Not on file   Years of education: Not on file   Highest education level: Not on file  Occupational History  Not on file  Tobacco Use   Smoking status: Former   Smokeless tobacco: Never  Vaping Use   Vaping Use: Never used  Substance and Sexual Activity   Alcohol use: No   Drug use: No   Sexual activity: Not on file  Other Topics Concern   Not on file  Social History Narrative   Not on file   Social Determinants of Health   Financial Resource Strain: Not on file  Food Insecurity: Not on file  Transportation Needs: Not on file  Physical Activity: Not on file  Stress: Not on file  Social Connections: Not on file  Intimate Partner Violence: Not on file      Review of Systems  Constitutional:   Negative for activity change, appetite change, chills, fatigue, fever and unexpected weight change.  HENT: Negative.  Negative for congestion, ear pain, rhinorrhea, sore throat and trouble swallowing.   Eyes: Negative.   Respiratory: Negative.  Negative for cough, chest tightness, shortness of breath and wheezing.   Cardiovascular: Negative.  Negative for chest pain and palpitations.  Gastrointestinal: Negative.  Negative for abdominal pain, blood in stool, constipation, diarrhea, nausea and vomiting.  Endocrine: Negative.   Genitourinary:  Negative for difficulty urinating, dysuria, frequency, hematuria and urgency.       Incontinence  Musculoskeletal:  Positive for arthralgias and gait problem (somewhat shuffling and slow but no further deterioration). Negative for back pain, joint swelling, myalgias and neck pain.       Hip pain  Skin: Negative.  Negative for rash and wound.  Allergic/Immunologic: Negative.  Negative for immunocompromised state.  Neurological:  Positive for weakness. Negative for dizziness, seizures, numbness and headaches.  Hematological: Negative.   Psychiatric/Behavioral:  Positive for agitation (improving some) and sleep disturbance. Negative for behavioral problems, self-injury and suicidal ideas. The patient is nervous/anxious.    Vital Signs: BP 138/78    Pulse 64    Temp 98.1 F (36.7 C)    Resp 16    Ht 5\' 3"  (1.6 m)    Wt 138 lb (62.6 kg)    SpO2 98%    BMI 24.45 kg/m    Physical Exam Constitutional:      General: He is not in acute distress.    Appearance: Normal appearance. He is normal weight. He is not ill-appearing.  HENT:     Head: Normocephalic and atraumatic.  Eyes:     Pupils: Pupils are equal, round, and reactive to light.  Cardiovascular:     Rate and Rhythm: Normal rate and regular rhythm.  Pulmonary:     Effort: Pulmonary effort is normal. No respiratory distress.  Neurological:     Mental Status: He is alert. Mental status is at  baseline. He is disoriented.     Cranial Nerves: No cranial nerve deficit.     Motor: Weakness present.     Coordination: Coordination normal.     Gait: Gait abnormal (slow, shuffling).  Psychiatric:        Mood and Affect: Mood normal.        Behavior: Behavior is agitated (at times at home but doing well today). Behavior is cooperative.       Assessment/Plan: 1. Essential hypertension Blood pressure is stable, refills ordered - metoprolol succinate (TOPROL-XL) 25 MG 24 hr tablet; Take 1 tablet (25 mg total) by mouth daily.  Dispense: 90 tablet; Refill: 1  2. Primary osteoarthritis of right hip Improved with meloxicam 7.5 mg twice daily.   3. Vitamin  D deficiency Continue weekly vitamin D 50,000 units.   4. Prostate enlargement Continue tamsulosin   5. Moderate vascular dementia with agitation Increase risperidone dose to 1 mg at bedtime, follow up in 3 months.  - risperiDONE (RISPERDAL) 1 MG tablet; Take 1 tablet (1 mg total) by mouth at bedtime.  Dispense: 90 tablet; Refill: 1   General Counseling: [name] verbalizes understanding of the findings of todays visit and agrees with plan of treatment. I have discussed any further diagnostic evaluation that may be needed or ordered today. We also reviewed his medications today. he has been encouraged to call the office with any questions or concerns that should arise related to todays visit.    No orders of the defined types were placed in this encounter.   Meds ordered this encounter  Medications   risperiDONE (RISPERDAL) 1 MG tablet    Sig: Take 1 tablet (1 mg total) by mouth at bedtime.    Dispense:  90 tablet    Refill:  1   metoprolol succinate (TOPROL-XL) 25 MG 24 hr tablet    Sig: Take 1 tablet (25 mg total) by mouth daily.    Dispense:  90 tablet    Refill:  1    Return in about 3 months (around 10/07/2021) for F/U, med refill, [place] PCP.   Total time spent:30 Minutes Time spent includes review of chart,  medications, test results, and follow up plan with the patient.   [place] Controlled Substance Database was reviewed by me.  This patient was seen by Jonetta Osgood, FNP-C in collaboration with Dr. Clayborn Bigness as a part of collaborative care agreement.   Ivana Nicastro R. Valetta Fuller, MSN, FNP-C Internal medicine

## 2021-08-09 ENCOUNTER — Other Ambulatory Visit: Payer: Self-pay | Admitting: Nurse Practitioner

## 2021-08-09 DIAGNOSIS — M1611 Unilateral primary osteoarthritis, right hip: Secondary | ICD-10-CM

## 2021-08-12 ENCOUNTER — Other Ambulatory Visit: Payer: Self-pay | Admitting: Nurse Practitioner

## 2021-08-12 DIAGNOSIS — E559 Vitamin D deficiency, unspecified: Secondary | ICD-10-CM

## 2021-08-13 ENCOUNTER — Other Ambulatory Visit: Payer: Self-pay | Admitting: Internal Medicine

## 2021-09-25 ENCOUNTER — Other Ambulatory Visit: Payer: Self-pay | Admitting: Nurse Practitioner

## 2021-09-25 DIAGNOSIS — F01B11 Vascular dementia, moderate, with agitation: Secondary | ICD-10-CM

## 2021-10-08 ENCOUNTER — Other Ambulatory Visit: Payer: Self-pay | Admitting: Nurse Practitioner

## 2021-10-08 DIAGNOSIS — N400 Enlarged prostate without lower urinary tract symptoms: Secondary | ICD-10-CM

## 2021-10-09 ENCOUNTER — Ambulatory Visit: Payer: Medicare HMO | Admitting: Nurse Practitioner

## 2021-10-25 ENCOUNTER — Encounter: Payer: Self-pay | Admitting: Nurse Practitioner

## 2021-10-25 ENCOUNTER — Telehealth: Payer: Self-pay

## 2021-10-25 ENCOUNTER — Ambulatory Visit (INDEPENDENT_AMBULATORY_CARE_PROVIDER_SITE_OTHER): Payer: Medicare HMO | Admitting: Nurse Practitioner

## 2021-10-25 VITALS — BP 137/70 | HR 60 | Temp 98.2°F | Resp 16 | Ht 64.0 in | Wt 138.0 lb

## 2021-10-25 DIAGNOSIS — R6889 Other general symptoms and signs: Secondary | ICD-10-CM

## 2021-10-25 DIAGNOSIS — R630 Anorexia: Secondary | ICD-10-CM

## 2021-10-25 DIAGNOSIS — R531 Weakness: Secondary | ICD-10-CM

## 2021-10-25 DIAGNOSIS — F03911 Unspecified dementia, unspecified severity, with agitation: Secondary | ICD-10-CM

## 2021-10-25 DIAGNOSIS — F01C11 Vascular dementia, severe, with agitation: Secondary | ICD-10-CM

## 2021-10-25 MED ORDER — RISPERIDONE 1 MG PO TABS: 2.0000 mg | ORAL_TABLET | Freq: Every day | ORAL | 1 refills | Status: DC

## 2021-10-25 MED ORDER — ALPRAZOLAM 0.5 MG PO TABS: 0.5000 mg | ORAL_TABLET | Freq: Two times a day (BID) | ORAL | 0 refills | Status: DC | PRN

## 2021-10-25 NOTE — Telephone Encounter (Signed)
Send message to [facility] palliative care that we put referral please review

## 2021-10-25 NOTE — Progress Notes (Signed)
[facility] [place], [place] [address]  Internal MEDICINE  Office Visit Note  Patient Name: Carlos Fischer  [id number]  [id number]  Date of Service: 10/25/2021  Chief Complaint  Patient presents with   Follow-up    Pt is getting weaker and falling more often   Hyperlipidemia   Hypertension    HPI Carlos Fischer presents for follow-up visit for hypertension, hyperlipidemia and vascular dementia.  At his previous office visit in February, his risperidone dose was increased to 1 mg at bedtime and he is continued to be provided with alprazolam as needed for increased agitation especially in the later half of the day.  The patient is accompanied to the office visit today by his son who states that the patient has gotten progressively weaker physically and was brought into the office visit in a wheelchair today.  The patient's son also reports that his agitation has gotten worse at night and they have had to increase his alprazolam dose from 0.25 to 0.5 mg.  The patient son reports that the patient does well in the morning and first half of the day and is able to do some ADLs independently or semiindependently and he is able to communicate better during the first half of the day.  As the day progresses, he reports that the patient gets more more confused and will try to bite kick and scratch and hit at night. It is becoming increasingly difficult to get the patient into the car and into the doctor's office to come for office visits in person.     Current Medication: Outpatient Encounter Medications as of 10/25/2021  Medication Sig   ALPRAZolam (XANAX) 0.5 MG tablet Take 1 tablet (0.5 mg total) by mouth 2 (two) times daily as needed for anxiety.   aspirin EC 81 MG tablet Take 81 mg by mouth daily.   atorvastatin (LIPITOR) 40 MG tablet TAKE 1 TABLET EVERY DAY   lisinopril (ZESTRIL) 10 MG tablet TAKE 1 TABLET EVERY DAY   meloxicam (MOBIC) 7.5 MG tablet TAKE  1 TABLET(7.5 MG) BY MOUTH IN THE MORNING AND AT BEDTIME   memantine (NAMENDA) 5 MG tablet TAKE 2 TABLETS TWICE DAILY   metoprolol succinate (TOPROL-XL) 25 MG 24 hr tablet Take 1 tablet (25 mg total) by mouth daily.   tamsulosin (FLOMAX) 0.4 MG CAPS capsule TAKE 2 CAPSULES (0.8 MG TOTAL) BY MOUTH DAILY. (DOSE INCREASE)   Vitamin D, Ergocalciferol, (DRISDOL) 1.25 MG (50000 UNIT) CAPS capsule TAKE 1 CAPSULE BY MOUTH EVERY 7 DAYS   [DISCONTINUED] ALPRAZolam (XANAX) 0.25 MG tablet Take 1 tablet (0.25 mg total) by mouth 2 (two) times daily as needed for anxiety (or agitation).   [DISCONTINUED] risperiDONE (RISPERDAL) 1 MG tablet Take 1 tablet (1 mg total) by mouth at bedtime.   risperiDONE (RISPERDAL) 1 MG tablet Take 2 tablets (2 mg total) by mouth at bedtime.   No facility-administered encounter medications on file as of 10/25/2021.    Surgical History: Past Surgical History:  Procedure Laterality Date   CAROTID ENDARTERECTOMY  01/02/2012    Medical History: Past Medical History:  Diagnosis Date   Carotid artery occlusion    Carotid stenosis    Dementia ([place])    Hyperlipidemia    Hypertension    Stroke [facility])    Hemorrhagic stroke    Family History: Family History  Problem Relation Age of Onset   Cancer Mother    Kidney disease Father     Social History  Socioeconomic History   Marital status: Unknown    Spouse name: Not on file   Number of children: Not on file   Years of education: Not on file   Highest education level: Not on file  Occupational History   Not on file  Tobacco Use   Smoking status: Former   Smokeless tobacco: Never  Vaping Use   Vaping Use: Never used  Substance and Sexual Activity   Alcohol use: No   Drug use: No   Sexual activity: Not on file  Other Topics Concern   Not on file  Social History Narrative   Not on file   Social Determinants of Health   Financial Resource Strain: Not on file  Food Insecurity: Not on file  Transportation  Needs: Not on file  Physical Activity: Not on file  Stress: Not on file  Social Connections: Not on file  Intimate Partner Violence: Not on file      Review of Systems  Constitutional:  Positive for activity change (walking all the time, has to be reminded to eat.) and appetite change (eating less). Negative for chills, fatigue and unexpected weight change.  HENT:  Negative for congestion, rhinorrhea, sneezing and sore throat.   Eyes:  Negative for redness.  Respiratory: Negative.  Negative for cough, chest tightness, shortness of breath and wheezing.   Cardiovascular: Negative.  Negative for chest pain and palpitations.  Gastrointestinal:  Negative for abdominal pain, constipation, diarrhea, nausea and vomiting.  Genitourinary:  Negative for dysuria and frequency.  Musculoskeletal:  Positive for arthralgias, back pain and gait problem. Negative for joint swelling and neck pain.  Skin:  Negative for rash.  Neurological:  Positive for weakness. Negative for tremors and numbness.       Cognitive difficulties related to progression of vascular dementia  Hematological:  Negative for adenopathy. Does not bruise/bleed easily.  Psychiatric/Behavioral:  Positive for agitation, confusion and sleep disturbance. Negative for behavioral problems (Depression), self-injury and suicidal ideas. The patient is not nervous/anxious.     Vital Signs: BP 137/70   Pulse 60   Temp 98.2 F (36.8 C)   Resp 16   Ht '5\' 4"'$  (1.626 m)   Wt 138 lb (62.6 kg)   SpO2 98%   BMI 23.69 kg/m    Physical Exam Vitals reviewed.  Constitutional:      General: He is awake. He is not in acute distress.    Appearance: Normal appearance. He is well-developed, well-groomed and normal weight. He is not ill-appearing.  HENT:     Head: Normocephalic and atraumatic.  Eyes:     Pupils: Pupils are equal, round, and reactive to light.  Cardiovascular:     Rate and Rhythm: Normal rate and regular rhythm.  Pulmonary:      Effort: Pulmonary effort is normal. No respiratory distress.  Neurological:     Mental Status: He is alert. He is disoriented and confused.     Motor: Weakness present.     Coordination: Coordination abnormal.     Gait: Gait abnormal.  Psychiatric:        Mood and Affect: Mood normal. Affect is blunt and flat.        Speech: Speech is delayed.        Behavior: Behavior is agitated (as the day progresses), aggressive (at night, he becomes aggressive, biting, kicking, hitting and fighting) and combative (at night). Behavior is cooperative.        Thought Content: Thought content is not paranoid. Thought  content does not include homicidal or suicidal ideation.        Cognition and Memory: Cognition is impaired. Memory is impaired.        Judgment: Judgment is impulsive.        Assessment/Plan: 1. Impaired quality of life Patient has had decreased functioning related to progression of vascular dementia. With generalized weakness, decreased appetite and increased behavioral disturbances and agitation. Palliative care referral ordered for assistance with care coordination, advanced care planning, and symptom management. It is more difficult for the patient to come into the office due to increased generalized weakness so having palliative care involved will provide a way to medically and holistically assess the patient without having him come into the office.  - Amb Referral to Palliative Care  2. Generalized weakness Please see problem #1 - Amb Referral to Palliative Care  3. Decreased appetite Please see problem #1 - Amb Referral to Palliative Care  4. Severe vascular dementia with agitation (HCC) Progression of dementia symptoms, alprazolam and risperidone doses increased. Will follow up with patient's son via telehealth visit in 1 month to see how the increased doses are helping.  - ALPRAZolam (XANAX) 0.5 MG tablet; Take 1 tablet (0.5 mg total) by mouth 2 (two) times daily as needed  for anxiety.  Dispense: 60 tablet; Refill: 0 - risperiDONE (RISPERDAL) 1 MG tablet; Take 2 tablets (2 mg total) by mouth at bedtime.  Dispense: 180 tablet; Refill: 1 - Amb Referral to Palliative Care  5. Agitation due to dementia [facility]) See problem #4 - ALPRAZolam (XANAX) 0.5 MG tablet; Take 1 tablet (0.5 mg total) by mouth 2 (two) times daily as needed for anxiety.  Dispense: 60 tablet; Refill: 0 - Amb Referral to Palliative Care   General Counseling: [name] verbalizes understanding of the findings of todays visit and agrees with plan of treatment. I have discussed any further diagnostic evaluation that may be needed or ordered today. We also reviewed his medications today. he has been encouraged to call the office with any questions or concerns that should arise related to todays visit.    Orders Placed This Encounter  Procedures   Amb Referral to Palliative Care    Meds ordered this encounter  Medications   ALPRAZolam (XANAX) 0.5 MG tablet    Sig: Take 1 tablet (0.5 mg total) by mouth 2 (two) times daily as needed for anxiety.    Dispense:  60 tablet    Refill:  0    Please note increased dose, please fill today.   risperiDONE (RISPERDAL) 1 MG tablet    Sig: Take 2 tablets (2 mg total) by mouth at bedtime.    Dispense:  180 tablet    Refill:  1    Please note increased dose please fill today    Return in about 4 weeks (around 11/22/2021) for F/U for med dose adjustments, may do telehealth due to patient's declining physical status. .   Total time spent:30 Minutes Time spent includes review of chart, medications, test results, and follow up plan with the patient.   [place] Controlled Substance Database was reviewed by me.  This patient was seen by Jonetta Osgood, FNP-C in collaboration with Dr. Clayborn Bigness as a part of collaborative care agreement.   Lyfe Reihl R. Valetta Fuller, MSN, FNP-C Internal medicine

## 2021-10-26 ENCOUNTER — Encounter: Payer: Self-pay | Admitting: Nurse Practitioner

## 2021-10-29 ENCOUNTER — Other Ambulatory Visit: Payer: Self-pay | Admitting: Nurse Practitioner

## 2021-10-29 ENCOUNTER — Other Ambulatory Visit: Payer: Self-pay

## 2021-10-29 DIAGNOSIS — F01B11 Vascular dementia, moderate, with agitation: Secondary | ICD-10-CM

## 2021-10-29 DIAGNOSIS — I10 Essential (primary) hypertension: Secondary | ICD-10-CM

## 2021-10-29 MED ORDER — METOPROLOL SUCCINATE ER 25 MG PO TB24: 25.0000 mg | ORAL_TABLET | Freq: Every day | ORAL | 1 refills | Status: DC

## 2021-11-12 ENCOUNTER — Other Ambulatory Visit: Payer: Medicaid Other | Admitting: Student

## 2021-11-12 DIAGNOSIS — R630 Anorexia: Secondary | ICD-10-CM

## 2021-11-12 DIAGNOSIS — R2681 Unsteadiness on feet: Secondary | ICD-10-CM

## 2021-11-12 DIAGNOSIS — F01511 Vascular dementia, unspecified severity, with agitation: Secondary | ICD-10-CM

## 2021-11-12 DIAGNOSIS — Z515 Encounter for palliative care: Secondary | ICD-10-CM

## 2021-11-12 DIAGNOSIS — R531 Weakness: Secondary | ICD-10-CM

## 2021-11-12 DIAGNOSIS — R4586 Emotional lability: Secondary | ICD-10-CM

## 2021-11-14 ENCOUNTER — Other Ambulatory Visit: Payer: Self-pay | Admitting: Nurse Practitioner

## 2021-11-14 DIAGNOSIS — F01B11 Vascular dementia, moderate, with agitation: Secondary | ICD-10-CM

## 2021-11-23 ENCOUNTER — Telehealth: Payer: Self-pay

## 2021-11-23 ENCOUNTER — Ambulatory Visit (INDEPENDENT_AMBULATORY_CARE_PROVIDER_SITE_OTHER): Payer: Medicare HMO | Admitting: Nurse Practitioner

## 2021-11-23 ENCOUNTER — Encounter: Payer: Self-pay | Admitting: Nurse Practitioner

## 2021-11-23 VITALS — BP 120/67 | HR 59 | Temp 98.5°F | Resp 16 | Ht 63.0 in | Wt 125.0 lb

## 2021-11-23 DIAGNOSIS — F01C11 Vascular dementia, severe, with agitation: Secondary | ICD-10-CM

## 2021-11-23 DIAGNOSIS — R531 Weakness: Secondary | ICD-10-CM

## 2021-11-23 DIAGNOSIS — R1319 Other dysphagia: Secondary | ICD-10-CM | POA: Diagnosis not present

## 2021-11-23 DIAGNOSIS — R630 Anorexia: Secondary | ICD-10-CM

## 2021-11-23 MED ORDER — ALPRAZOLAM 0.5 MG PO TABS: 0.5000 mg | ORAL_TABLET | Freq: Two times a day (BID) | ORAL | 1 refills | Status: DC | PRN

## 2021-11-23 MED ORDER — MEMANTINE HCL 5 MG PO TABS: ORAL_TABLET | ORAL | 1 refills | Status: DC

## 2021-11-23 NOTE — Telephone Encounter (Signed)
Gave verbal order [facility] [phone] for speech therapy and also send message to alyssa for referral to add for today visit for [place] regional Modified Barium Swallow Study

## 2021-11-23 NOTE — Progress Notes (Signed)
[facility] [place], [place] [address]  Internal MEDICINE  Office Visit Note  Patient Name: Carlos Fischer  [id number]  [id number]  Date of Service: 11/23/2021  Chief Complaint  Patient presents with   Follow-up   Hyperlipidemia   Hypertension    HPI [name] presents for follow-up visit to discuss medication adjustments.  At his previous office visit palliative care was consulted to evaluate the patient and help the family to care for him in the best way possible for the patient.  The palliative care provider added mirtazapine 7.5 mg at bedtime to his medication regimen to help with some of the depressive symptoms, increased appetite some and help him sleep.  The patient has weaned off of melatonin down to one 5 mg tablet at bedtime.  He is taking 0.25 mg of alprazolam at bedtime right now but if he wakes up in the middle the night and is having any mood disturbances or agitation his family will give him the other half of the tablet of alprazolam.  At his previous office visit his risperidone dose was increased to 2 mg at bedtime but the family has not increased the dose yet so he is still getting 1 mg at bedtime. His blood pressure and other vital signs are stable and within normal limits. The patient is also having some difficulty swallowing at times and home health wants to get speech therapy and Occupational Therapy involved and also do some imaging to help and see what the swallowing issue is.     Current Medication: Outpatient Encounter Medications as of 11/23/2021  Medication Sig   aspirin EC 81 MG tablet Take 81 mg by mouth daily.   atorvastatin (LIPITOR) 40 MG tablet TAKE 1 TABLET EVERY DAY   lisinopril (ZESTRIL) 10 MG tablet TAKE 1 TABLET EVERY DAY   meloxicam (MOBIC) 7.5 MG tablet TAKE 1 TABLET(7.5 MG) BY MOUTH IN THE MORNING AND AT BEDTIME   metoprolol succinate (TOPROL-XL) 25 MG 24 hr tablet Take 1 tablet (25 mg total) by mouth  daily.   mirtazapine (REMERON) 7.5 MG tablet Take 7.5 mg by mouth at bedtime.   risperiDONE (RISPERDAL) 1 MG tablet Take 2 tablets (2 mg total) by mouth at bedtime.   tamsulosin (FLOMAX) 0.4 MG CAPS capsule TAKE 2 CAPSULES (0.8 MG TOTAL) BY MOUTH DAILY. (DOSE INCREASE)   Vitamin D, Ergocalciferol, (DRISDOL) 1.25 MG (50000 UNIT) CAPS capsule TAKE 1 CAPSULE BY MOUTH EVERY 7 DAYS   [DISCONTINUED] ALPRAZolam (XANAX) 0.5 MG tablet Take 1 tablet (0.5 mg total) by mouth 2 (two) times daily as needed for anxiety.   [DISCONTINUED] memantine (NAMENDA) 5 MG tablet TAKE 2 TABLETS TWICE DAILY (Patient taking differently: Taking 2 tablets in morning, 1 tablet at night.)   ALPRAZolam (XANAX) 0.5 MG tablet Take 1 tablet (0.5 mg total) by mouth 2 (two) times daily as needed for anxiety.   memantine (NAMENDA) 5 MG tablet Take 2 tablets by mouth in the morning daily and take 1 tablet by mouth in the evening daily.   No facility-administered encounter medications on file as of 11/23/2021.    Surgical History: Past Surgical History:  Procedure Laterality Date   CAROTID ENDARTERECTOMY  01/02/2012    Medical History: Past Medical History:  Diagnosis Date   Carotid artery occlusion    Carotid stenosis    Dementia ([place])    Hyperlipidemia    Hypertension    Stroke [facility])    Hemorrhagic stroke    Family  History: Family History  Problem Relation Age of Onset   Cancer Mother    Kidney disease Father     Social History   Socioeconomic History   Marital status: Unknown    Spouse name: Not on file   Number of children: Not on file   Years of education: Not on file   Highest education level: Not on file  Occupational History   Not on file  Tobacco Use   Smoking status: Former   Smokeless tobacco: Never  Vaping Use   Vaping Use: Never used  Substance and Sexual Activity   Alcohol use: No   Drug use: No   Sexual activity: Not on file  Other Topics Concern   Not on file  Social History Narrative    Not on file   Social Determinants of Health   Financial Resource Strain: Not on file  Food Insecurity: Not on file  Transportation Needs: Not on file  Physical Activity: Not on file  Stress: Not on file  Social Connections: Not on file  Intimate Partner Violence: Not on file      Review of Systems  Constitutional:  Positive for activity change (walking all the time, has to be reminded to eat.) and appetite change (eating less). Negative for chills, fatigue and unexpected weight change.  HENT:  Negative for congestion, rhinorrhea, sneezing and sore throat.   Eyes:  Negative for redness.  Respiratory: Negative.  Negative for cough, chest tightness, shortness of breath and wheezing.   Cardiovascular: Negative.  Negative for chest pain and palpitations.  Gastrointestinal:  Negative for abdominal pain, constipation, diarrhea, nausea and vomiting.  Genitourinary:  Negative for dysuria and frequency.  Musculoskeletal:  Positive for arthralgias, back pain and gait problem. Negative for joint swelling and neck pain.  Skin:  Negative for rash.  Neurological:  Positive for weakness. Negative for tremors and numbness.       Cognitive difficulties related to progression of vascular dementia  Hematological:  Negative for adenopathy. Does not bruise/bleed easily.  Psychiatric/Behavioral:  Positive for agitation, confusion and sleep disturbance. Negative for behavioral problems (Depression), self-injury and suicidal ideas. The patient is not nervous/anxious.     Vital Signs: BP 120/67   Pulse (!) 59   Temp 98.5 F (36.9 C)   Resp 16   Ht '5\' 3"'$  (1.6 m)   Wt 125 lb (56.7 kg)   SpO2 99%   BMI 22.14 kg/m    Physical Exam Vitals reviewed.  Constitutional:      General: He is awake. He is not in acute distress.    Appearance: Normal appearance. He is well-developed, well-groomed and normal weight. He is not ill-appearing.  HENT:     Head: Normocephalic and atraumatic.  Eyes:      Pupils: Pupils are equal, round, and reactive to light.  Cardiovascular:     Rate and Rhythm: Normal rate and regular rhythm.  Pulmonary:     Effort: Pulmonary effort is normal. No respiratory distress.  Neurological:     Mental Status: He is alert. He is disoriented and confused.     Motor: Weakness present.     Coordination: Coordination abnormal.     Gait: Gait abnormal.  Psychiatric:        Mood and Affect: Mood normal. Affect is blunt and flat.        Speech: Speech is delayed.        Behavior: Behavior is agitated (as the day progresses), aggressive (at night, he becomes  aggressive, biting, kicking, hitting and fighting) and combative (at night). Behavior is cooperative.        Thought Content: Thought content is not paranoid. Thought content does not include homicidal or suicidal ideation.        Cognition and Memory: Cognition is impaired. Memory is impaired.        Judgment: Judgment is impulsive.        Assessment/Plan: 1. Other dysphagia Swallow study ordered  - DG SWALLOW STUDY OP MEDICARE SPEECH PATH; Future  2. Decreased appetite Mirtazapine prescribed by the palliative care provider to help increase appetite along with also helping the patient sleep and decreasing anxiety. - mirtazapine (REMERON) 7.5 MG tablet; Take 7.5 mg by mouth at bedtime.  3. Severe vascular dementia with agitation (HCC) Mirtazapine added to help with agitation, currently taking 0.25 mg of alprazolam at bedtime and might take another half tablet later if he wakes up.  Updated Namenda dosing as 2 tablets in the morning and 1 tablet in the evening.  Patient is currently taking 1 mg of risperidone at bedtime but we will try increasing it to 2 mg at bedtime and the new prescription is at the pharmacy and was sent at the previous office visit. - mirtazapine (REMERON) 7.5 MG tablet; Take 7.5 mg by mouth at bedtime. - ALPRAZolam (XANAX) 0.5 MG tablet; Take 1 tablet (0.5 mg total) by mouth 2 (two) times  daily as needed for anxiety.  Dispense: 60 tablet; Refill: 1 - memantine (NAMENDA) 5 MG tablet; Take 2 tablets by mouth in the morning daily and take 1 tablet by mouth in the evening daily.  Dispense: 360 tablet; Refill: 1   General Counseling: [name] verbalizes understanding of the findings of todays visit and agrees with plan of treatment. I have discussed any further diagnostic evaluation that may be needed or ordered today. We also reviewed his medications today. he has been encouraged to call the office with any questions or concerns that should arise related to todays visit.    Orders Placed This Encounter  Procedures   DG SWALLOW STUDY OP MEDICARE SPEECH PATH    Meds ordered this encounter  Medications   ALPRAZolam (XANAX) 0.5 MG tablet    Sig: Take 1 tablet (0.5 mg total) by mouth 2 (two) times daily as needed for anxiety.    Dispense:  60 tablet    Refill:  1    Patient's family will call when they need this refilled. Do not automatically refill   memantine (NAMENDA) 5 MG tablet    Sig: Take 2 tablets by mouth in the morning daily and take 1 tablet by mouth in the evening daily.    Dispense:  360 tablet    Refill:  1    Return in about 3 months (around 02/23/2022) for F/U, Aina Rossbach PCP medication review and check in.   Total time spent:30 Minutes Time spent includes review of chart, medications, test results, and follow up plan with the patient.   [place] Controlled Substance Database was reviewed by me.  This patient was seen by Jonetta Osgood, FNP-C in collaboration with Dr. Clayborn Bigness as a part of collaborative care agreement.   Reneta Niehaus R. Valetta Fuller, MSN, FNP-C Internal medicine

## 2021-11-28 ENCOUNTER — Telehealth: Payer: Self-pay | Admitting: Student

## 2021-11-28 NOTE — Telephone Encounter (Signed)
Palliative NP returned call to ST seeing patient. She is requesting modified barium swallow. She is made aware that PCP would have to order this; she will reach out to PCP.

## 2021-12-03 ENCOUNTER — Other Ambulatory Visit: Payer: Self-pay | Admitting: Internal Medicine

## 2021-12-03 ENCOUNTER — Telehealth: Payer: Self-pay

## 2021-12-03 ENCOUNTER — Ambulatory Visit
Admission: RE | Admit: 2021-12-03 | Discharge: 2021-12-03 | Disposition: A | Discharge status: Home / Self Care | Payer: Medicare HMO | Source: Ambulatory Visit | Attending: Internal Medicine | Admitting: Internal Medicine

## 2021-12-03 DIAGNOSIS — R1312 Dysphagia, oropharyngeal phase: Secondary | ICD-10-CM

## 2021-12-03 NOTE — Addendum Note (Signed)
Addended by: Lavera Guise on: 12/03/2021 11:52 AM   Modules accepted: Orders

## 2021-12-03 NOTE — Telephone Encounter (Signed)
DG SWALLOW STUDY OP MEDICARE SPEECH PATH order requested by Alyssa on 11/23/21. Received call this morning from [place], home health nurse, requesting order be changed to modified barium swallow as a stat order. Order entered by dfk. I got patient scheduled for this afternoon.   Received call from x-ray tech w/ [facility] stating the order entered by Alyssa was correct order since she wanted speech therapy involved. I explained to her new order by dfk was requested by home health nurse. Tech stated they will cancel new order and r/s patient using Alyssa's order-Toni

## 2021-12-05 ENCOUNTER — Ambulatory Visit: Payer: Medicare HMO

## 2021-12-07 ENCOUNTER — Ambulatory Visit
Admission: RE | Admit: 2021-12-07 | Discharge: 2021-12-07 | Disposition: A | Discharge status: Home / Self Care | Payer: Medicare HMO | Source: Ambulatory Visit | Attending: Nurse Practitioner | Admitting: Nurse Practitioner

## 2021-12-07 DIAGNOSIS — R1319 Other dysphagia: Secondary | ICD-10-CM | POA: Diagnosis not present

## 2021-12-07 DIAGNOSIS — R6339 Other feeding difficulties: Secondary | ICD-10-CM | POA: Diagnosis not present

## 2021-12-07 DIAGNOSIS — R1310 Dysphagia, unspecified: Secondary | ICD-10-CM | POA: Diagnosis not present

## 2021-12-07 NOTE — Progress Notes (Signed)
Modified Barium Swallow Progress Note  Patient Details  Name: Carlos Fischer MRN: [id number] Date of Birth: 12-03-1950  Today's Date: 12/07/2021  Modified Barium Swallow completed.  Full report located under Chart Review in the Imaging Section.  Brief recommendations include the following:  Clinical Impression  Pt presents with cognition based disorganized oral phase when consuming thin liquids via cup, nectar thick liquids via cup, puree, whole barium tablet with thin liquids. Despite this, his oral phase was functional across all consistencies administered. Pt's  swallow is initiated at the level of the pyriform sinuses likely d/t pt's age and advanced cognitive decline. Pt with one instance of penetration during the swallow because he was moving and trying to talk at the same time. Penetrates were expelled during the swallow. Results of this study as well as video were shared with pt's son and pt's [facility] nurse via phone. At this time, recommend pt continue consuming regular diet with thin liquids via cup and medicine whole with thin liquids via cup. Should pt continue holding medicine in his mouth, suggest providing medicine with ice cream, yogurt, applesauce. No further ST services are indicated at this time.   Swallow Evaluation Recommendations       SLP Diet Recommendations: Regular solids;Thin liquid   Liquid Administration via: Cup   Medication Administration: Whole meds with liquid   Supervision: Patient able to self feed   Compensations: Minimize environmental distractions;Slow rate;Small sips/bites   Postural Changes: Seated upright at 90 degrees   Oral Care Recommendations: Oral care BID      Faron Whitelock B. Rutherford Nail, M.S., CCC-SLP, Mining engineer Certified Brain Injury [place]  [place] Office [phone] Ascom [phone] Fax [phone]

## 2021-12-19 ENCOUNTER — Telehealth: Payer: Self-pay

## 2021-12-19 NOTE — Telephone Encounter (Signed)
357 pm.  Message received from [place] at [facility] requesting a call back.  Return call made and [facility] documents need to be signed by Charlann Boxer, NP.  Advised NP is currently on vacation but will return tomorrow.  Mariann Laster advised that supervising MD will also need to sign documents per Medicare guidelines.  Advised that I would notify NP of above.

## 2022-01-05 ENCOUNTER — Other Ambulatory Visit: Payer: Self-pay | Admitting: Nurse Practitioner

## 2022-01-05 DIAGNOSIS — M1611 Unilateral primary osteoarthritis, right hip: Secondary | ICD-10-CM

## 2022-01-06 ENCOUNTER — Other Ambulatory Visit: Payer: Self-pay | Admitting: Nurse Practitioner

## 2022-01-06 DIAGNOSIS — F01C11 Vascular dementia, severe, with agitation: Secondary | ICD-10-CM

## 2022-01-10 ENCOUNTER — Other Ambulatory Visit: Payer: Medicare HMO | Admitting: Student

## 2022-01-10 DIAGNOSIS — Z515 Encounter for palliative care: Secondary | ICD-10-CM

## 2022-01-10 DIAGNOSIS — F01511 Vascular dementia, unspecified severity, with agitation: Secondary | ICD-10-CM

## 2022-01-10 DIAGNOSIS — R531 Weakness: Secondary | ICD-10-CM

## 2022-01-10 DIAGNOSIS — R4586 Emotional lability: Secondary | ICD-10-CM

## 2022-01-10 NOTE — Progress Notes (Signed)
Therapist, nutritional Palliative Care Consult Note Telephone: [phone]  Fax: [phone]    Date of encounter: 01/10/22 10:06 AM PATIENT NAME: Carlos Fischer [address] [place] [address]   [phone] (home)  DOB: 02/27/1951 MRN: [id number] PRIMARY CARE PROVIDER:    Sallyanne Kuster, NP,  [address] [place] [address] [phone]  REFERRING PROVIDER:   Sallyanne Kuster, NP [address] [place],  [place] [address] [phone]  RESPONSIBLE PARTY:    Contact Information     Name Relation Home Work Mobile   Gerlene Fee   [phone]   Odelia Gage [phone]          I met face to face with patient and family in the home. Palliative Care was asked to follow this patient by consultation request of  Sallyanne Kuster, NP to address advance care planning and complex medical decision making. This is a follow up visit.                                   ASSESSMENT AND PLAN / RECOMMENDATIONS:   Advance Care Planning/Goals of Care: Goals include to maximize quality of life and symptom management. Patient/health care surrogate gave his/her permission to discuss. Our advance care planning conversation included a discussion about:    The value and importance of advance care planning  Experiences with loved ones who have been seriously ill or have died  Exploration of personal, cultural or spiritual beliefs that might influence medical decisions  Exploration of goals of care in the event of a sudden injury or illness  CODE STATUS: Full Code  Education provided on palliative medicine.  Family would like for patient to remain in the home with ongoing support.  Denies any need for additional caregiver support at this time.  Patient has completed therapy.  Symptom Management/Plan:  Vascular dementia-patient requires assistance with all ADLs.  His anxiety and agitation have improved per family. Continue  memantine, alprazolam, respite all.  Patient's appetite has improved since starting mirtazapine. Continue to offer foods patient enjoys. Patient was evaluated by speech therapy and recent swallow study; oropharyngeal delay consistent with cognitive decline. Continues on Regular diet. Monitor for dysphagia; aspiration precautions.   Mood-improvement noted with mirtazapine; continue mirtazapine 7.5 mg QHS.  Marcella Dubs completed therapy. Family to continue assisting with adl's, monitor for falls/safety.   Follow up Palliative Care Visit: Palliative care will continue to follow for complex medical decision making, advance care planning, and clarification of goals. Return in 10-12 weeks or prn.   This visit was coded based on medical decision making (MDM).  PPS: 40%  HOSPICE ELIGIBILITY/DIAGNOSIS: TBD  Chief Complaint: Palliative Medicine follow up visit.   HISTORY OF PRESENT ILLNESS:  Carlos Fischer is a 71 y.o. year old male  with  vascular dementia, essential hypertension, hyperlipidemia, BPH, s/p stroke. 2013.    Has complete physical and speech therapy. He is able to eat and drink regular consistencies; finger foods recommended. His appetite has improved. Mood,agitation has improved and he is sleeping better. He does require redirection with adl care. No pain, shortness of breath.   Patient received sitting in his living room. His daughter is giving him something to drink. He is noted to be leaning to left side; no worsening her family. He is cooperative with assessment; no anxiety or agitation observed.   History obtained from review of EMR, discussion with primary  team, and interview with family, facility staff/caregiver and/or Carlos Fischer.  I reviewed available labs, medications, imaging, studies and related documents from the EMR.  Records reviewed and summarized above.   ROS  Unable to contribute d/t his dementia.   Physical Exam:  Pulse 80, b/p  140/84, sats 99% on room air Constitutional: NAD General: frail appearing EYES: anicteric sclera, lids intact, no discharge  ENMT: intact hearing, oral mucous membranes moist, dentition intact CV: S1S2, RRR, no LE edema Pulmonary: LCTA, no increased work of breathing, no cough, room air Abdomen:  normo-active BS + 4 quadrants, soft and non tender, no ascites GU: deferred MSK: moves all extremities Skin: warm and dry, no rashes or wounds on visible skin Neuro: + generalized weakness,  A & O to person, familiars Psych: non-anxious affect Hem/lymph/immuno: no widespread bruising   Thank you for the opportunity to participate in the care of Carlos Fischer.  The palliative care team will continue to follow. Please call our office at [phone] if we can be of additional assistance.   Ezekiel Slocumb, NP   COVID-19 PATIENT SCREENING TOOL Asked and negative response unless otherwise noted:   Have you had symptoms of covid, tested positive or been in contact with someone with symptoms/positive test in the past 5-10 days? No

## 2022-02-03 ENCOUNTER — Other Ambulatory Visit: Payer: Self-pay | Admitting: Nurse Practitioner

## 2022-02-03 DIAGNOSIS — E559 Vitamin D deficiency, unspecified: Secondary | ICD-10-CM

## 2022-02-22 ENCOUNTER — Ambulatory Visit: Payer: Medicare HMO | Admitting: Nurse Practitioner

## 2022-03-04 ENCOUNTER — Ambulatory Visit (INDEPENDENT_AMBULATORY_CARE_PROVIDER_SITE_OTHER): Payer: Medicare HMO | Admitting: Nurse Practitioner

## 2022-03-04 ENCOUNTER — Encounter: Payer: Self-pay | Admitting: Nurse Practitioner

## 2022-03-04 VITALS — BP 130/67 | HR 76 | Temp 98.1°F | Resp 16 | Ht 63.0 in | Wt 125.0 lb

## 2022-03-04 DIAGNOSIS — F01C11 Vascular dementia, severe, with agitation: Secondary | ICD-10-CM | POA: Diagnosis not present

## 2022-03-04 DIAGNOSIS — R531 Weakness: Secondary | ICD-10-CM | POA: Diagnosis not present

## 2022-03-04 DIAGNOSIS — R630 Anorexia: Secondary | ICD-10-CM

## 2022-03-04 DIAGNOSIS — I10 Essential (primary) hypertension: Secondary | ICD-10-CM

## 2022-03-04 NOTE — Progress Notes (Signed)
[facility] [place],  [address]  Internal MEDICINE  Office Visit Note  Patient Name: Carlos Fischer  [id number]  [id number]  Date of Service: 03/04/2022  Chief Complaint  Patient presents with   Follow-up   Hyperlipidemia   Hypertension    HPI Carlos Fischer presents for a follow up visit for cerebral amyloid angiopathy, dementia, decreased appetite, generalized weakness Sleeping better with mirtazapine Less episodes of depressive symptoms and crying spells Increased appetite with added mirtazapine PT/OT comes to the home to work with patient but left side has become progressively weaker.  Speech is declining, starting to repeat words and talks about memories at night.  No refills needed, meds are working as well as can be expected right now.     Current Medication: Outpatient Encounter Medications as of 03/04/2022  Medication Sig   ALPRAZolam (XANAX) 0.5 MG tablet Take 1 tablet (0.5 mg total) by mouth 2 (two) times daily as needed for anxiety.   aspirin EC 81 MG tablet Take 81 mg by mouth daily.   atorvastatin (LIPITOR) 40 MG tablet TAKE 1 TABLET EVERY DAY   lisinopril (ZESTRIL) 10 MG tablet TAKE 1 TABLET EVERY DAY   meloxicam (MOBIC) 7.5 MG tablet TAKE 1 TABLET(7.5 MG) BY MOUTH IN THE MORNING AND AT BEDTIME   memantine (NAMENDA) 5 MG tablet Take 2 tablets by mouth in the morning daily and take 1 tablet by mouth in the evening daily.   metoprolol succinate (TOPROL-XL) 25 MG 24 hr tablet Take 1 tablet (25 mg total) by mouth daily.   mirtazapine (REMERON) 7.5 MG tablet Take 7.5 mg by mouth at bedtime.   risperiDONE (RISPERDAL) 1 MG tablet TAKE 1 TABLET(1 MG) BY MOUTH AT BEDTIME   tamsulosin (FLOMAX) 0.4 MG CAPS capsule TAKE 2 CAPSULES (0.8 MG TOTAL) BY MOUTH DAILY. (DOSE INCREASE)   Vitamin D, Ergocalciferol, (DRISDOL) 1.25 MG (50000 UNIT) CAPS capsule TAKE 1 CAPSULE BY MOUTH EVERY 7 DAYS   No facility-administered encounter  medications on file as of 03/04/2022.    Surgical History: Past Surgical History:  Procedure Laterality Date   CAROTID ENDARTERECTOMY  01/02/2012    Medical History: Past Medical History:  Diagnosis Date   Carotid artery occlusion    Carotid stenosis    Dementia ([place])    Hyperlipidemia    Hypertension    Stroke ([place])    Hemorrhagic stroke    Family History: Family History  Problem Relation Age of Onset   Cancer Mother    Kidney disease Father     Social History   Socioeconomic History   Marital status: Unknown    Spouse name: Not on file   Number of children: Not on file   Years of education: Not on file   Highest education level: Not on file  Occupational History   Not on file  Tobacco Use   Smoking status: Former   Smokeless tobacco: Never  Vaping Use   Vaping Use: Never used  Substance and Sexual Activity   Alcohol use: No   Drug use: No   Sexual activity: Not on file  Other Topics Concern   Not on file  Social History Narrative   Not on file   Social Determinants of Health   Financial Resource Strain: Not on file  Food Insecurity: Not on file  Transportation Needs: Not on file  Physical Activity: Not on file  Stress: Not on file  Social Connections: Not on file  Intimate Partner Violence: Not  on file      Review of Systems  Constitutional:  Positive for activity change (walking all the time, has to be reminded to eat.) and appetite change (eating less). Negative for chills, fatigue and unexpected weight change.  HENT:  Negative for congestion, rhinorrhea, sneezing and sore throat.   Eyes:  Negative for redness.  Respiratory: Negative.  Negative for cough, chest tightness, shortness of breath and wheezing.   Cardiovascular: Negative.  Negative for chest pain and palpitations.  Gastrointestinal:  Negative for abdominal pain, constipation, diarrhea, nausea and vomiting.  Genitourinary:  Negative for dysuria and frequency.  Musculoskeletal:   Positive for arthralgias, back pain and gait problem. Negative for joint swelling and neck pain.  Skin:  Negative for rash.  Neurological:  Positive for weakness. Negative for tremors and numbness.       Cognitive difficulties related to progression of vascular dementia  Hematological:  Negative for adenopathy. Does not bruise/bleed easily.  Psychiatric/Behavioral:  Positive for agitation, confusion and sleep disturbance. Negative for behavioral problems (Depression), self-injury and suicidal ideas. The patient is not nervous/anxious.     Vital Signs: BP 130/67   Pulse 76   Temp 98.1 F (36.7 C)   Resp 16   Ht '5\' 3"'$  (1.6 m)   Wt 125 lb (56.7 kg)   SpO2 97%   BMI 22.14 kg/m    Physical Exam Vitals reviewed.  Constitutional:      General: He is awake. He is not in acute distress.    Appearance: Normal appearance. He is well-developed, well-groomed and normal weight. He is not ill-appearing.  HENT:     Head: Normocephalic and atraumatic.  Eyes:     Pupils: Pupils are equal, round, and reactive to light.  Cardiovascular:     Rate and Rhythm: Normal rate and regular rhythm.  Pulmonary:     Effort: Pulmonary effort is normal. No respiratory distress.  Neurological:     Mental Status: He is alert. He is disoriented and confused.     Motor: Weakness present.     Coordination: Coordination abnormal.     Gait: Gait abnormal.  Psychiatric:        Mood and Affect: Mood normal. Affect is blunt and flat.        Speech: Speech is delayed.        Behavior: Behavior is agitated (as the day progresses), aggressive (at night, he becomes aggressive, biting, kicking, hitting and fighting) and combative (at night). Behavior is cooperative.        Thought Content: Thought content is not paranoid. Thought content does not include homicidal or suicidal ideation.        Cognition and Memory: Cognition is impaired. Memory is impaired.        Judgment: Judgment is impulsive.         Assessment/Plan: 1. Essential hypertension BP stable, continue current medications as prescribed  2. Generalized weakness Continue PT and OT as tolerated by the patient. Due to medical condition, family is aware that he will progressively worsen but are doing what they can to help him maintain his strength while he can  3. Decreased appetite Improving, continue current dose of mirtazapine  4. Severe vascular dementia with agitation (HCC) Continue current medications as prescribed, continue 24/7 supervision for safety of patient, continue PT/OT as tolerated by patient. General follow up in 3 months, may switch to virtual if having difficulty getting patient to the clinic for in-person appt.    General Counseling: [name] verbalizes  understanding of the findings of todays visit and agrees with plan of treatment. I have discussed any further diagnostic evaluation that may be needed or ordered today. We also reviewed his medications today. he has been encouraged to call the office with any questions or concerns that should arise related to todays visit.    No orders of the defined types were placed in this encounter.   No orders of the defined types were placed in this encounter.   Return in about 3 months (around 06/04/2022) for F/U, Melinda Gwinner PCP, may change to virtual if needed .   Total time spent:30 Minutes Time spent includes review of chart, medications, test results, and follow up plan with the patient.   [place] Controlled Substance Database was reviewed by me.  This patient was seen by Jonetta Osgood, FNP-C in collaboration with Dr. Clayborn Bigness as a part of collaborative care agreement.   Hillary Struss R. Valetta Fuller, MSN, FNP-C Internal medicine

## 2022-04-01 ENCOUNTER — Encounter: Payer: Self-pay | Admitting: Physician Assistant

## 2022-04-01 ENCOUNTER — Other Ambulatory Visit: Payer: Self-pay | Admitting: Nurse Practitioner

## 2022-04-01 ENCOUNTER — Ambulatory Visit (INDEPENDENT_AMBULATORY_CARE_PROVIDER_SITE_OTHER): Payer: Medicare HMO | Admitting: Physician Assistant

## 2022-04-01 VITALS — BP 116/69 | HR 76 | Temp 98.2°F | Resp 16 | Ht 63.0 in | Wt 125.0 lb

## 2022-04-01 DIAGNOSIS — I10 Essential (primary) hypertension: Secondary | ICD-10-CM

## 2022-04-01 DIAGNOSIS — R519 Headache, unspecified: Secondary | ICD-10-CM

## 2022-04-01 DIAGNOSIS — R0982 Postnasal drip: Secondary | ICD-10-CM | POA: Diagnosis not present

## 2022-04-01 NOTE — Progress Notes (Signed)
[facility] [place], [place] [address]  Internal MEDICINE  Office Visit Note  Patient Name: Carlos Fischer  [id number]  [id number]  Date of Service: 04/01/2022  Chief Complaint  Patient presents with   Acute Visit    Headache for 3-4 days     HPI Pt is here for a sick visit. -Patient is here with his son Cory Roughen who provides all of the information. He is a nurse himself and is aware of things to look out for as far as acute changes  -Patient has significant vascular dementia and is unable to answer most questions -He has been complaining of headache for 3-4 days, generalized. Son reports that he does tend to repeat himself and is unsure if the pain is really still there or if he is just repeating himself now. -Nose has been draining some and weather changes may be contributing to headache/sinus pressure -Has done some tylenol and may try tylenol sinus headache and nasal spray to see if this helps -Doing PT/OT for chronic weakness, no new changes -Son denies any real changes in personality or speech from his baseline, but will monitor for these. Also states no recent falls or injuries. -He no longer sees neurology -Given vascular dementia, will consider CT head if patient continues to complain of headaches and son will call back if ongoing or any changes  Current Medication:  Outpatient Encounter Medications as of 04/01/2022  Medication Sig   ALPRAZolam (XANAX) 0.5 MG tablet Take 1 tablet (0.5 mg total) by mouth 2 (two) times daily as needed for anxiety.   aspirin EC 81 MG tablet Take 81 mg by mouth daily.   atorvastatin (LIPITOR) 40 MG tablet TAKE 1 TABLET EVERY DAY   lisinopril (ZESTRIL) 10 MG tablet TAKE 1 TABLET EVERY DAY   meloxicam (MOBIC) 7.5 MG tablet TAKE 1 TABLET(7.5 MG) BY MOUTH IN THE MORNING AND AT BEDTIME   memantine (NAMENDA) 5 MG tablet Take 2 tablets by mouth in the morning daily and take 1 tablet by mouth in the  evening daily.   metoprolol succinate (TOPROL-XL) 25 MG 24 hr tablet TAKE 1 TABLET EVERY DAY   mirtazapine (REMERON) 7.5 MG tablet Take 7.5 mg by mouth at bedtime.   risperiDONE (RISPERDAL) 1 MG tablet TAKE 1 TABLET(1 MG) BY MOUTH AT BEDTIME   tamsulosin (FLOMAX) 0.4 MG CAPS capsule TAKE 2 CAPSULES (0.8 MG TOTAL) BY MOUTH DAILY. (DOSE INCREASE)   Vitamin D, Ergocalciferol, (DRISDOL) 1.25 MG (50000 UNIT) CAPS capsule TAKE 1 CAPSULE BY MOUTH EVERY 7 DAYS   No facility-administered encounter medications on file as of 04/01/2022.      Medical History: Past Medical History:  Diagnosis Date   Carotid artery occlusion    Carotid stenosis    Dementia (HCC)    Hyperlipidemia    Hypertension    Stroke ([place])    Hemorrhagic stroke     Vital Signs: BP 116/69   Pulse 76   Temp 98.2 F (36.8 C)   Resp 16   Ht '5\' 3"'$  (1.6 m)   Wt 125 lb (56.7 kg)   SpO2 98%   BMI 22.14 kg/m    Review of Systems  Constitutional:  Negative for fatigue and fever.  HENT:  Positive for postnasal drip. Negative for mouth sores.   Respiratory:  Negative for cough.   Cardiovascular:  Negative for chest pain.  Genitourinary:  Negative for flank pain.  Neurological:  Positive for headaches.  Psychiatric/Behavioral: Negative.  Physical Exam Vitals and nursing note reviewed.  Constitutional:      Appearance: Normal appearance.  HENT:     Head: Normocephalic and atraumatic.     Nose: Rhinorrhea present.     Mouth/Throat:     Pharynx: Posterior oropharyngeal erythema present.  Eyes:     Extraocular Movements: Extraocular movements intact.  Cardiovascular:     Rate and Rhythm: Normal rate and regular rhythm.     Heart sounds: Normal heart sounds.  Pulmonary:     Effort: Pulmonary effort is normal.     Breath sounds: Normal breath sounds.  Neurological:     Mental Status: He is alert. Mental status is at baseline.     Gait: Gait abnormal.       Assessment/Plan: 1. Acute nonintractable  headache, unspecified headache type Possibly due to sinuses with postnasal drip and weather changes. Will start nasal spray and continue tylenol. Will call back or go to ED if not improving or if any new or worsening symptoms. May need to consider updating CT head due to patient's hx of vascular dementia and inability to fully communicate.   2. Postnasal drip Start nasal spray and monitor symptoms   General Counseling: [name] verbalizes understanding of the findings of todays visit and agrees with plan of treatment. I have discussed any further diagnostic evaluation that may be needed or ordered today. We also reviewed his medications today. he has been encouraged to call the office with any questions or concerns that should arise related to todays visit.    Counseling:    No orders of the defined types were placed in this encounter.   No orders of the defined types were placed in this encounter.   Time spent:30 Minutes

## 2022-04-02 ENCOUNTER — Telehealth: Payer: Self-pay

## 2022-04-05 ENCOUNTER — Other Ambulatory Visit: Payer: Self-pay | Admitting: Nurse Practitioner

## 2022-04-05 DIAGNOSIS — R519 Headache, unspecified: Secondary | ICD-10-CM

## 2022-04-05 DIAGNOSIS — M1611 Unilateral primary osteoarthritis, right hip: Secondary | ICD-10-CM

## 2022-04-12 ENCOUNTER — Telehealth: Payer: Self-pay | Admitting: Student

## 2022-04-12 NOTE — Telephone Encounter (Signed)
Palliative NP spoke with patient's son. Discussed changes, restructuring of community palliative program. Patient does not currently meet criteria. He is advised to notify palliative should patient decline, need hospice services. He expresses understanding. Will sign off at this time.

## 2022-04-15 ENCOUNTER — Other Ambulatory Visit: Payer: Medicare HMO | Admitting: Student

## 2022-04-19 ENCOUNTER — Ambulatory Visit
Admission: RE | Admit: 2022-04-19 | Discharge: 2022-04-19 | Disposition: A | Discharge status: Home / Self Care | Payer: Medicare HMO | Source: Ambulatory Visit | Attending: Nurse Practitioner | Admitting: Nurse Practitioner

## 2022-04-19 DIAGNOSIS — R519 Headache, unspecified: Secondary | ICD-10-CM | POA: Insufficient documentation

## 2022-04-19 MED ORDER — IOHEXOL 300 MG/ML  SOLN
75.0000 mL | Freq: Once | INTRAMUSCULAR | Status: AC | PRN
  Administered 2022-04-19: 75 mL via INTRAVENOUS

## 2022-04-24 ENCOUNTER — Telehealth (INDEPENDENT_AMBULATORY_CARE_PROVIDER_SITE_OTHER): Payer: Medicare HMO | Admitting: Nurse Practitioner

## 2022-04-24 ENCOUNTER — Encounter: Payer: Self-pay | Admitting: Nurse Practitioner

## 2022-04-24 VITALS — Resp 16 | Ht 63.0 in | Wt 125.0 lb

## 2022-04-24 DIAGNOSIS — F01C11 Vascular dementia, severe, with agitation: Secondary | ICD-10-CM | POA: Diagnosis not present

## 2022-04-24 DIAGNOSIS — R630 Anorexia: Secondary | ICD-10-CM

## 2022-04-24 DIAGNOSIS — R519 Headache, unspecified: Secondary | ICD-10-CM

## 2022-04-24 MED ORDER — MIRTAZAPINE 7.5 MG PO TABS: 7.5000 mg | ORAL_TABLET | Freq: Every day | ORAL | 1 refills | Status: DC

## 2022-04-24 NOTE — Progress Notes (Signed)
[facility] [place], [place] [address]  Internal MEDICINE  Telephone Visit  Patient Name: Carlos Fischer  [id number]  [id number]  Date of Service: 04/24/2022  I connected with the patient at 1200 by telephone and verified the patients identity using two identifiers.   I discussed the limitations, risks, security and privacy concerns of performing an evaluation and management service by telephone and the availability of in person appointments. I also discussed with the patient that there may be a patient responsible charge related to the service.  The patient expressed understanding and agrees to proceed.    Chief Complaint  Patient presents with   Telephone Screen    [phone]   Telephone Assessment    Review ct results.     HPI Carlos Fischer presents for a telehealth virtual visit to discuss CT results and medications. --has been having persistent headaches so CT scan of head was done. Results showed no acute or significant changes and were comparable to prior imaging from 2020.  --Palliative care no longer doing home visits with patient. Need refills of mirtazapine.  --no other med changes, doing ok --headaches seem to be better, he is not c/o his head hurting anymore. May have had a cold or virus that was causing the headache   Current Medication: Outpatient Encounter Medications as of 04/24/2022  Medication Sig   ALPRAZolam (XANAX) 0.5 MG tablet Take 1 tablet (0.5 mg total) by mouth 2 (two) times daily as needed for anxiety.   aspirin EC 81 MG tablet Take 81 mg by mouth daily.   atorvastatin (LIPITOR) 40 MG tablet TAKE 1 TABLET EVERY DAY   lisinopril (ZESTRIL) 10 MG tablet TAKE 1 TABLET EVERY DAY   meloxicam (MOBIC) 7.5 MG tablet TAKE 1 TABLET(7.5 MG) BY MOUTH IN THE MORNING AND AT BEDTIME   memantine (NAMENDA) 5 MG tablet Take 2 tablets by mouth in the morning daily and take 1 tablet by mouth in the evening daily.   metoprolol  succinate (TOPROL-XL) 25 MG 24 hr tablet TAKE 1 TABLET EVERY DAY   risperiDONE (RISPERDAL) 1 MG tablet TAKE 1 TABLET(1 MG) BY MOUTH AT BEDTIME   tamsulosin (FLOMAX) 0.4 MG CAPS capsule TAKE 2 CAPSULES (0.8 MG TOTAL) BY MOUTH DAILY. (DOSE INCREASE)   Vitamin D, Ergocalciferol, (DRISDOL) 1.25 MG (50000 UNIT) CAPS capsule TAKE 1 CAPSULE BY MOUTH EVERY 7 DAYS   [DISCONTINUED] mirtazapine (REMERON) 7.5 MG tablet Take 7.5 mg by mouth at bedtime.   mirtazapine (REMERON) 7.5 MG tablet Take 1 tablet (7.5 mg total) by mouth at bedtime.   No facility-administered encounter medications on file as of 04/24/2022.    Surgical History: Past Surgical History:  Procedure Laterality Date   CAROTID ENDARTERECTOMY  01/02/2012    Medical History: Past Medical History:  Diagnosis Date   Carotid artery occlusion    Carotid stenosis    Dementia ([place])    Hyperlipidemia    Hypertension    Stroke ([place])    Hemorrhagic stroke    Family History: Family History  Problem Relation Age of Onset   Cancer Mother    Kidney disease Father     Social History   Socioeconomic History   Marital status: Unknown    Spouse name: Not on file   Number of children: Not on file   Years of education: Not on file   Highest education level: Not on file  Occupational History   Not on file  Tobacco Use   Smoking status:  Former   Smokeless tobacco: Never  Scientific laboratory technician Use: Never used  Substance and Sexual Activity   Alcohol use: No   Drug use: No   Sexual activity: Not on file  Other Topics Concern   Not on file  Social History Narrative   Not on file   Social Determinants of Health   Financial Resource Strain: Not on file  Food Insecurity: Not on file  Transportation Needs: Not on file  Physical Activity: Not on file  Stress: Not on file  Social Connections: Not on file  Intimate Partner Violence: Not on file      Review of Systems  Constitutional:  Negative for chills, fatigue and unexpected  weight change.  HENT:  Negative for congestion, rhinorrhea, sneezing and sore throat.   Eyes:  Negative for redness.  Respiratory:  Negative for cough, chest tightness and shortness of breath.   Cardiovascular:  Negative for chest pain and palpitations.  Gastrointestinal:  Negative for abdominal pain, constipation, diarrhea, nausea and vomiting.  Genitourinary:  Negative for dysuria and frequency.  Musculoskeletal:  Negative for arthralgias, back pain, joint swelling and neck pain.  Skin:  Negative for rash.  Neurological: Negative.  Negative for tremors, numbness and headaches.  Hematological:  Negative for adenopathy. Does not bruise/bleed easily.  Psychiatric/Behavioral:  Negative for behavioral problems (Depression), sleep disturbance and suicidal ideas. The patient is not nervous/anxious.     Vital Signs: Resp 16   Ht '5\' 3"'$  (1.6 m)   Wt 125 lb (56.7 kg)   BMI 22.14 kg/m    Observation/Objective:  unable to speak with patient due to medical condition. Spoke with patient's son over the phone. Patient seems to be at baseline right now.     Assessment/Plan: 1. Acute nonintractable headache, unspecified headache type Resolved. CT head results discussed with patient's son over telephone.  2. Decreased appetite Medication is helping and the patient is eating more, continue as prescribed - mirtazapine (REMERON) 7.5 MG tablet; Take 1 tablet (7.5 mg total) by mouth at bedtime.  Dispense: 90 tablet; Refill: 1  3. Severe vascular dementia with agitation (HCC) Continue mirtazapine as prescribed. At baseline right now.  - mirtazapine (REMERON) 7.5 MG tablet; Take 1 tablet (7.5 mg total) by mouth at bedtime.  Dispense: 90 tablet; Refill: 1   General Counseling: Carlos Fischer verbalizes understanding of the findings of today's phone visit and agrees with plan of treatment. I have discussed any further diagnostic evaluation that may be needed or ordered today. We also reviewed his medications  today. he has been encouraged to call the office with any questions or concerns that should arise related to todays visit.  Return for previously scheduled, CPE, Carlos Fischer PCP in january 2024 next month.   No orders of the defined types were placed in this encounter.   Meds ordered this encounter  Medications   mirtazapine (REMERON) 7.5 MG tablet    Sig: Take 1 tablet (7.5 mg total) by mouth at bedtime.    Dispense:  90 tablet    Refill:  1    Time spent:10 Minutes Time spent with patient included reviewing progress notes, labs, imaging studies, and discussing plan for follow up.  [place] Controlled Substance Database was reviewed by me for overdose risk score (ORS) if appropriate.  This patient was seen by Jonetta Osgood, FNP-C in collaboration with Dr. Clayborn Bigness as a part of collaborative care agreement.  Amrom Ore R. Valetta Fuller, MSN, FNP-C Internal medicine

## 2022-05-03 ENCOUNTER — Telehealth: Payer: Self-pay

## 2022-05-03 MED ORDER — FLUCONAZOLE 150 MG PO TABS: 150.0000 mg | ORAL_TABLET | Freq: Once | ORAL | 0 refills | Status: AC

## 2022-05-03 NOTE — Telephone Encounter (Signed)
Has oral thrush, needs fluconazole

## 2022-05-09 NOTE — Telephone Encounter (Signed)
done

## 2022-05-10 ENCOUNTER — Other Ambulatory Visit: Payer: Self-pay

## 2022-05-10 MED ORDER — FLUCONAZOLE 150 MG PO TABS: 150.0000 mg | ORAL_TABLET | Freq: Every day | ORAL | 0 refills | Status: DC

## 2022-05-10 NOTE — Telephone Encounter (Signed)
Pt called that he is still having thrush as per alyssa send diflucan 1 tab daily for 7 days

## 2022-05-16 ENCOUNTER — Other Ambulatory Visit (INDEPENDENT_AMBULATORY_CARE_PROVIDER_SITE_OTHER): Payer: Self-pay | Admitting: Vascular Surgery

## 2022-05-16 DIAGNOSIS — I6523 Occlusion and stenosis of bilateral carotid arteries: Secondary | ICD-10-CM

## 2022-05-17 ENCOUNTER — Other Ambulatory Visit: Payer: Self-pay | Admitting: Nurse Practitioner

## 2022-05-17 DIAGNOSIS — F01C11 Vascular dementia, severe, with agitation: Secondary | ICD-10-CM

## 2022-05-21 ENCOUNTER — Encounter (INDEPENDENT_AMBULATORY_CARE_PROVIDER_SITE_OTHER): Payer: Medicare HMO

## 2022-05-21 ENCOUNTER — Ambulatory Visit (INDEPENDENT_AMBULATORY_CARE_PROVIDER_SITE_OTHER): Payer: Medicaid Other | Admitting: Vascular Surgery

## 2022-05-27 ENCOUNTER — Other Ambulatory Visit: Payer: Self-pay | Admitting: Nurse Practitioner

## 2022-05-27 DIAGNOSIS — N400 Enlarged prostate without lower urinary tract symptoms: Secondary | ICD-10-CM

## 2022-05-29 ENCOUNTER — Ambulatory Visit: Payer: Medicare HMO | Admitting: Nurse Practitioner

## 2022-06-03 ENCOUNTER — Telehealth: Payer: Medicare HMO | Admitting: Nurse Practitioner

## 2022-06-14 ENCOUNTER — Ambulatory Visit: Payer: Medicare HMO | Admitting: Nurse Practitioner

## 2022-06-17 ENCOUNTER — Other Ambulatory Visit: Payer: Self-pay | Admitting: Internal Medicine

## 2022-06-17 DIAGNOSIS — I10 Essential (primary) hypertension: Secondary | ICD-10-CM

## 2022-06-26 ENCOUNTER — Ambulatory Visit
Admission: RE | Admit: 2022-06-26 | Discharge: 2022-06-26 | Disposition: A | Discharge status: Home / Self Care | Payer: Medicare HMO | Source: Ambulatory Visit | Attending: Nurse Practitioner | Admitting: Nurse Practitioner

## 2022-06-26 ENCOUNTER — Other Ambulatory Visit: Payer: Self-pay | Admitting: Nurse Practitioner

## 2022-06-26 ENCOUNTER — Ambulatory Visit
Admission: RE | Admit: 2022-06-26 | Discharge: 2022-06-26 | Disposition: A | Discharge status: Home / Self Care | Payer: Medicare HMO | Attending: Nurse Practitioner | Admitting: Nurse Practitioner

## 2022-06-26 ENCOUNTER — Ambulatory Visit (INDEPENDENT_AMBULATORY_CARE_PROVIDER_SITE_OTHER): Payer: Medicare HMO | Admitting: Nurse Practitioner

## 2022-06-26 VITALS — BP 134/71 | HR 77 | Temp 98.6°F | Resp 16 | Ht 63.0 in | Wt 135.0 lb

## 2022-06-26 DIAGNOSIS — S9002XA Contusion of left ankle, initial encounter: Secondary | ICD-10-CM | POA: Diagnosis not present

## 2022-06-26 DIAGNOSIS — S8252XA Displaced fracture of medial malleolus of left tibia, initial encounter for closed fracture: Secondary | ICD-10-CM | POA: Diagnosis not present

## 2022-06-26 DIAGNOSIS — M25572 Pain in left ankle and joints of left foot: Secondary | ICD-10-CM | POA: Insufficient documentation

## 2022-06-26 DIAGNOSIS — M25472 Effusion, left ankle: Secondary | ICD-10-CM | POA: Diagnosis not present

## 2022-06-26 DIAGNOSIS — M79672 Pain in left foot: Secondary | ICD-10-CM

## 2022-06-26 DIAGNOSIS — S82832A Other fracture of upper and lower end of left fibula, initial encounter for closed fracture: Secondary | ICD-10-CM | POA: Diagnosis not present

## 2022-06-26 DIAGNOSIS — F01C11 Vascular dementia, severe, with agitation: Secondary | ICD-10-CM

## 2022-06-26 DIAGNOSIS — M7989 Other specified soft tissue disorders: Secondary | ICD-10-CM | POA: Diagnosis not present

## 2022-06-26 MED ORDER — TRAMADOL HCL 50 MG PO TABS: 50.0000 mg | ORAL_TABLET | Freq: Four times a day (QID) | ORAL | 0 refills | Status: AC | PRN

## 2022-06-26 NOTE — Progress Notes (Unsigned)
[facility] [place], [place] [address]  Internal MEDICINE  Office Visit Note  Patient Name: Carlos Fischer  [id number]  [id number]  Date of Service: 06/26/2022  Chief Complaint  Patient presents with   Medicare Wellness   Hyperlipidemia   Hypertension     Hyperlipidemia  Hypertension   Carlos Fischer presents for an acute sick visit for left ankle pain and swelling.  Onset - today in the morning, nopticed upon awakening.  increased pain of left ankle and left foot.  Bruising purple and red to left ankle.  Difficulty bearing weight on left foot and ankle      Current Medication:  Outpatient Encounter Medications as of 06/26/2022  Medication Sig   ALPRAZolam (XANAX) 0.5 MG tablet Take 1 tablet (0.5 mg total) by mouth 2 (two) times daily as needed for anxiety.   aspirin EC 81 MG tablet Take 81 mg by mouth daily.   atorvastatin (LIPITOR) 40 MG tablet TAKE 1 TABLET EVERY DAY   fluconazole (DIFLUCAN) 150 MG tablet Take 1 tablet (150 mg total) by mouth daily.   lisinopril (ZESTRIL) 10 MG tablet TAKE 1 TABLET EVERY DAY   meloxicam (MOBIC) 7.5 MG tablet TAKE 1 TABLET(7.5 MG) BY MOUTH IN THE MORNING AND AT BEDTIME   memantine (NAMENDA) 5 MG tablet TAKE 2 TABLETS TWICE DAILY   metoprolol succinate (TOPROL-XL) 25 MG 24 hr tablet TAKE 1 TABLET EVERY DAY   mirtazapine (REMERON) 7.5 MG tablet Take 1 tablet (7.5 mg total) by mouth at bedtime.   risperiDONE (RISPERDAL) 1 MG tablet TAKE 1 TABLET(1 MG) BY MOUTH AT BEDTIME   tamsulosin (FLOMAX) 0.4 MG CAPS capsule TAKE 2 CAPSULES (0.8 MG TOTAL) BY MOUTH DAILY. (DOSE INCREASE)   traMADol (ULTRAM) 50 MG tablet Take 1 tablet (50 mg total) by mouth every 6 (six) hours as needed for moderate pain or severe pain. May give 1/2 tablet dose   Vitamin D, Ergocalciferol, (DRISDOL) 1.25 MG (50000 UNIT) CAPS capsule TAKE 1 CAPSULE BY MOUTH EVERY 7 DAYS   No facility-administered encounter medications on file as  of 06/26/2022.      Medical History: Past Medical History:  Diagnosis Date   Carotid artery occlusion    Carotid stenosis    Dementia (HCC)    Hyperlipidemia    Hypertension    Stroke (HCC)    Hemorrhagic stroke     Vital Signs: BP 134/71   Pulse 77   Temp 98.6 F (37 C)   Resp 16   Ht '5\' 3"'$  (1.6 m)   Wt 135 lb (61.2 kg)   SpO2 97%   BMI 23.91 kg/m    Review of Systems  Musculoskeletal:  Positive for arthralgias, gait problem and joint swelling.    Physical Exam Musculoskeletal:     Right ankle: Normal.     Left ankle: Swelling present. Tenderness present over the lateral malleolus and medial malleolus. Decreased range of motion.     Right foot: Normal.     Left foot: Decreased range of motion. Swelling and tenderness present.       Assessment/Plan:   General Counseling: Carlos Fischer verbalizes understanding of the findings of todays visit and agrees with plan of treatment. I have discussed any further diagnostic evaluation that may be needed or ordered today. We also reviewed his medications today. he has been encouraged to call the office with any questions or concerns that should arise related to todays visit.    Counseling:    Orders  Placed This Encounter  Procedures   DG Ankle Complete Left   DG Foot Complete Left    Meds ordered this encounter  Medications   traMADol (ULTRAM) 50 MG tablet    Sig: Take 1 tablet (50 mg total) by mouth every 6 (six) hours as needed for moderate pain or severe pain. May give 1/2 tablet dose    Dispense:  20 tablet    Refill:  0    Return if symptoms worsen or fail to improve, for will call with xray results. .  [place] Controlled Substance Database was reviewed by me for overdose risk score (ORS)  Time spent:*** Minutes Time spent with patient included reviewing progress notes, labs, imaging studies, and discussing plan for follow up.   This patient was seen by Jonetta Osgood, FNP-C in collaboration with Dr.  Clayborn Bigness as a part of collaborative care agreement.  Aanya Haynes R. Valetta Fuller, MSN, FNP-C Internal Medicine

## 2022-06-27 ENCOUNTER — Encounter: Payer: Self-pay | Admitting: Nurse Practitioner

## 2022-06-27 ENCOUNTER — Telehealth: Payer: Self-pay | Admitting: Nurse Practitioner

## 2022-06-27 ENCOUNTER — Telehealth: Payer: Self-pay

## 2022-06-27 ENCOUNTER — Other Ambulatory Visit: Payer: Self-pay

## 2022-06-27 DIAGNOSIS — F01C11 Vascular dementia, severe, with agitation: Secondary | ICD-10-CM

## 2022-06-27 NOTE — Telephone Encounter (Signed)
Late note from phone call on Wednesday 06/26/22 at 1700.   EXAM: LEFT ANKLE COMPLETE - 3+ VIEW   COMPARISON:  None Available.   FINDINGS: Acute appearing avulsion fracture tip of medial malleolus with soft tissue swelling. Acute oblique fracture distal fibula, involves the metaphysis and extends to the superolateral mortise. About 1/4 shaft diameter lateral displacement of distal fracture fragment. Vascular calcifications.   IMPRESSION: 1. Acute appearing avulsion fracture tip of medial malleolus. 2. Acute mildly displaced distal fibula fracture.  No acute fracture of the left foot  Called patient's son, Cory Roughen to inform him of the xray results.  Needs to be seen by orthopedic surgeon.  Next steps discussed with Cory Roughen --  Call [facility] to see if there is an opening today If no opening today, take patient to [facility] during urgent care hours starting at 1pm tomorrow.  Call [facility] to get a disc with the xray images to take to [facility] tomorrow morning.

## 2022-06-28 DIAGNOSIS — S8292XA Unspecified fracture of left lower leg, initial encounter for closed fracture: Secondary | ICD-10-CM | POA: Diagnosis not present

## 2022-06-28 NOTE — Telephone Encounter (Signed)
Patient son notified

## 2022-07-02 ENCOUNTER — Encounter (INDEPENDENT_AMBULATORY_CARE_PROVIDER_SITE_OTHER): Payer: Self-pay | Admitting: Vascular Surgery

## 2022-07-02 ENCOUNTER — Ambulatory Visit (INDEPENDENT_AMBULATORY_CARE_PROVIDER_SITE_OTHER): Payer: Medicare HMO | Admitting: Vascular Surgery

## 2022-07-02 ENCOUNTER — Ambulatory Visit (INDEPENDENT_AMBULATORY_CARE_PROVIDER_SITE_OTHER): Payer: Medicare HMO

## 2022-07-02 VITALS — BP 127/73 | HR 76 | Ht 63.0 in | Wt 135.0 lb

## 2022-07-02 DIAGNOSIS — I6523 Occlusion and stenosis of bilateral carotid arteries: Secondary | ICD-10-CM

## 2022-07-02 DIAGNOSIS — E785 Hyperlipidemia, unspecified: Secondary | ICD-10-CM | POA: Diagnosis not present

## 2022-07-02 DIAGNOSIS — I10 Essential (primary) hypertension: Secondary | ICD-10-CM

## 2022-07-02 NOTE — Assessment & Plan Note (Signed)
Carotid duplex reveals a patent right carotid endarterectomy and 1 to 39% left ICA stenosis.  No progression from previous studies.  Continue aspirin and Lipitor.  Recheck in 1 year

## 2022-07-02 NOTE — Progress Notes (Signed)
MRN : [id number]  Carlos Fischer is a 72 y.o. (05-10-1951) male who presents with chief complaint of No chief complaint on file. Marland Kitchen  History of Present Illness: Patient returns in follow-up of his carotid disease.  He is doing well today without any specific complaints.  He really does not provide much of the history and his son provides this.  Previous stroke with left hemiparesis but no recent symptoms. Carotid duplex reveals a patent right carotid endarterectomy and 1 to 39% left ICA stenosis.  No progression from previous studies.  Current Outpatient Medications  Medication Sig Dispense Refill   aspirin EC 81 MG tablet Take 81 mg by mouth daily.     atorvastatin (LIPITOR) 40 MG tablet TAKE 1 TABLET EVERY DAY 90 tablet 3   lisinopril (ZESTRIL) 10 MG tablet TAKE 1 TABLET EVERY DAY 90 tablet 3   melatonin 5 MG TABS Take 5 mg by mouth at bedtime as needed.     meloxicam (MOBIC) 7.5 MG tablet TAKE 1 TABLET(7.5 MG) BY MOUTH IN THE MORNING AND AT BEDTIME 60 tablet 2   memantine (NAMENDA) 5 MG tablet TAKE 2 TABLETS TWICE DAILY 360 tablet 3   metoprolol succinate (TOPROL-XL) 25 MG 24 hr tablet TAKE 1 TABLET EVERY DAY 90 tablet 3   mirtazapine (REMERON) 7.5 MG tablet Take 1 tablet (7.5 mg total) by mouth at bedtime. 90 tablet 1   risperiDONE (RISPERDAL) 1 MG tablet TAKE 1 TABLET(1 MG) BY MOUTH AT BEDTIME 90 tablet 1   tamsulosin (FLOMAX) 0.4 MG CAPS capsule TAKE 2 CAPSULES (0.8 MG TOTAL) BY MOUTH DAILY. (DOSE INCREASE) 180 capsule 3   traMADol (ULTRAM) 50 MG tablet Take 1 tablet (50 mg total) by mouth every 6 (six) hours as needed for moderate pain or severe pain. May give 1/2 tablet dose 20 tablet 0   Vitamin D, Ergocalciferol, (DRISDOL) 1.25 MG (50000 UNIT) CAPS capsule TAKE 1 CAPSULE BY MOUTH EVERY 7 DAYS 5 capsule 3   ALPRAZolam (XANAX) 0.5 MG tablet Take 1 tablet (0.5 mg total) by mouth 2 (two) times daily as needed for anxiety. (Patient not taking: Reported on 07/02/2022)  60 tablet 1   No current facility-administered medications for this visit.    Past Medical History:  Diagnosis Date   Carotid artery occlusion    Carotid stenosis    Dementia (HCC)    Hyperlipidemia    Hypertension    Stroke [facility])    Hemorrhagic stroke    Past Surgical History:  Procedure Laterality Date   CAROTID ENDARTERECTOMY  01/02/2012     Social History   Tobacco Use   Smoking status: Former   Smokeless tobacco: Never  Scientific laboratory technician Use: Never used  Substance Use Topics   Alcohol use: No   Drug use: No       Family History  Problem Relation Age of Onset   Cancer Mother    Kidney disease Father      Allergies  Allergen Reactions   Zyprexa [Olanzapine]     Agitated and aggressive      REVIEW OF SYSTEMS (Negative unless checked)  Constitutional: []$ Weight loss  []$ Fever  []$ Chills Cardiac: []$ Chest pain   []$ Chest pressure   []$ Palpitations   []$ Shortness of breath when laying flat   []$ Shortness of breath at rest   []$ Shortness of breath with exertion. Vascular:  []$ Pain in legs with walking   []$ Pain in legs at rest   []$ Pain in legs when laying  flat   []$ Claudication   []$ Pain in feet when walking  []$ Pain in feet at rest  []$ Pain in feet when laying flat   []$ History of DVT   []$ Phlebitis   []$ Swelling in legs   []$ Varicose veins   []$ Non-healing ulcers Pulmonary:   []$ Uses home oxygen   []$ Productive cough   []$ Hemoptysis   []$ Wheeze  []$ COPD   []$ Asthma Neurologic:  []$ Dizziness  []$ Blackouts   []$ Seizures   []$ History of stroke   []$ History of TIA  []$ Aphasia   []$ Temporary blindness   []$ Dysphagia   []$ Weakness or numbness in arms   []$ Weakness or numbness in legs Musculoskeletal:  [x]$ Arthritis   []$ Joint swelling   [x]$ Joint pain   []$ Low back pain Hematologic:  []$ Easy bruising  []$ Easy bleeding   []$ Hypercoagulable state   []$ Anemic  []$ Hepatitis Gastrointestinal:  []$ Blood in stool   []$ Vomiting blood  [x]$ Gastroesophageal reflux/heartburn   []$ Difficulty swallowing. Genitourinary:   []$ Chronic kidney disease   []$ Difficult urination  []$ Frequent urination  []$ Burning with urination   []$ Blood in urine Skin:  []$ Rashes   []$ Ulcers   []$ Wounds Psychological:  []$ History of anxiety   []$  History of major depression.  Physical Examination  Vitals:   07/02/22 1527  BP: 127/73  Pulse: 76  Weight: 135 lb (61.2 kg)  Height: 5' 3"$  (1.6 m)   Body mass index is 23.91 kg/m. Gen:  WD/WN, NAD Head: [place]/AT, No temporalis wasting. Ear/Nose/Throat: Hearing grossly intact, nares w/o erythema or drainage, trachea midline Eyes: Conjunctiva clear. Sclera non-icteric Neck: Supple.  No bruit  Pulmonary:  Good air movement, equal and clear to auscultation bilaterally.  Cardiac: RRR, No JVD Vascular:  Vessel Right Left  Radial Palpable Palpable           Musculoskeletal: left hemiparetic. In a wheelchair Neurologic: CN 2-12 intact. Sensation grossly intact in extremities.  Left hemiparetic Psychiatric: Judgment intact, Mood & affect appropriate for pt's clinical situation. Dermatologic: No rashes or ulcers noted.  No cellulitis or open wounds.   CBC Lab Results  Component Value Date   WBC 12.4 (H) 06/06/2021   HGB 14.9 06/06/2021   HCT 45.2 06/06/2021   MCV 95 06/06/2021   PLT 207 06/06/2021    BMET    Component Value Date/Time   NA 140 06/06/2021 0955   NA 139 01/03/2012 0604   K 4.0 06/06/2021 0955   K 3.6 01/03/2012 0604   CL 101 06/06/2021 0955   CL 106 01/03/2012 0604   CO2 23 06/06/2021 0955   CO2 26 01/03/2012 0604   GLUCOSE 108 (H) 06/06/2021 0955   GLUCOSE 114 (H) 01/03/2012 0604   BUN 12 06/06/2021 0955   BUN 5 (L) 01/03/2012 0604   CREATININE 0.75 (L) 06/06/2021 0955   CREATININE 0.61 01/03/2012 0604   CALCIUM 9.3 06/06/2021 0955   CALCIUM 8.5 01/03/2012 0604   GFRNONAA 94 04/27/2020 0929   GFRNONAA >60 01/03/2012 0604   GFRAA 109 04/27/2020 0929   GFRAA >60 01/03/2012 0604   CrCl cannot be calculated (Patient's most recent lab result is older than the  maximum 21 days allowed.).  COAG Lab Results  Component Value Date   INR 1.0 04/07/2012   INR 1.0 01/03/2012    Radiology DG Foot Complete Left  Result Date: 06/26/2022 CLINICAL DATA:  Pain swelling bruising EXAM: LEFT FOOT - COMPLETE 3+ VIEW COMPARISON:  None Available. FINDINGS: Vascular calcifications. No fracture or malalignment at the foot. Acute medial malleolar and distal fibular fractures. IMPRESSION: No acute fracture of the foot.  Acute medial malleolar and distal fibular fractures. Electronically Signed   By: Donavan Foil M.D.   On: 06/26/2022 16:11   DG Ankle Complete Left  Result Date: 06/26/2022 CLINICAL DATA:  Bruising and swelling EXAM: LEFT ANKLE COMPLETE - 3+ VIEW COMPARISON:  None Available. FINDINGS: Acute appearing avulsion fracture tip of medial malleolus with soft tissue swelling. Acute oblique fracture distal fibula, involves the metaphysis and extends to the superolateral mortise. About 1/4 shaft diameter lateral displacement of distal fracture fragment. Vascular calcifications. IMPRESSION: 1. Acute appearing avulsion fracture tip of medial malleolus. 2. Acute mildly displaced distal fibula fracture. Electronically Signed   By: Donavan Foil M.D.   On: 06/26/2022 16:10     Assessment/Plan Bilateral carotid artery stenosis Carotid duplex reveals a patent right carotid endarterectomy and 1 to 39% left ICA stenosis.  No progression from previous studies.  Continue aspirin and Lipitor.  Recheck in 1 year  Hyperlipidemia lipid control important in reducing the progression of atherosclerotic disease. Continue statin therapy     Essential hypertension blood pressure control important in reducing the progression of atherosclerotic disease. On appropriate oral medications.  Leotis Pain, MD  07/02/2022 4:48 PM    This note was created with Dragon medical transcription system.  Any errors from dictation are purely unintentional

## 2022-07-18 ENCOUNTER — Encounter: Payer: Self-pay | Admitting: Nurse Practitioner

## 2022-07-18 ENCOUNTER — Ambulatory Visit (INDEPENDENT_AMBULATORY_CARE_PROVIDER_SITE_OTHER): Payer: Medicare HMO | Admitting: Nurse Practitioner

## 2022-07-18 VITALS — BP 130/76 | HR 73 | Temp 97.7°F | Resp 16 | Ht 63.0 in

## 2022-07-18 DIAGNOSIS — I10 Essential (primary) hypertension: Secondary | ICD-10-CM | POA: Diagnosis not present

## 2022-07-18 DIAGNOSIS — Z125 Encounter for screening for malignant neoplasm of prostate: Secondary | ICD-10-CM | POA: Diagnosis not present

## 2022-07-18 DIAGNOSIS — E559 Vitamin D deficiency, unspecified: Secondary | ICD-10-CM

## 2022-07-18 DIAGNOSIS — E782 Mixed hyperlipidemia: Secondary | ICD-10-CM

## 2022-07-18 DIAGNOSIS — S82892D Other fracture of left lower leg, subsequent encounter for closed fracture with routine healing: Secondary | ICD-10-CM

## 2022-07-18 DIAGNOSIS — F01C11 Vascular dementia, severe, with agitation: Secondary | ICD-10-CM | POA: Diagnosis not present

## 2022-07-18 DIAGNOSIS — Z0001 Encounter for general adult medical examination with abnormal findings: Secondary | ICD-10-CM

## 2022-07-18 MED ORDER — ZOSTER VAC RECOMB ADJUVANTED 50 MCG/0.5ML IM SUSR: 0.5000 mL | Freq: Once | INTRAMUSCULAR | 0 refills | Status: AC

## 2022-07-18 MED ORDER — QUETIAPINE FUMARATE 25 MG PO TABS: 25.0000 mg | ORAL_TABLET | Freq: Every day | ORAL | 2 refills | Status: DC

## 2022-07-18 MED ORDER — TETANUS-DIPHTH-ACELL PERTUSSIS 5-2-15.5 LF-MCG/0.5 IM SUSP: 0.5000 mL | Freq: Once | INTRAMUSCULAR | 0 refills | Status: AC

## 2022-07-18 NOTE — Progress Notes (Signed)
[facility] [place], [place] [address]  Internal MEDICINE  Office Visit Note  Patient Name: Carlos Fischer  [id number]  QN:[id number]  Date of Service: 07/18/2022  Chief Complaint  Patient presents with   Medicare Wellness   Hyperlipidemia   Hypertension    HPI Carlos Fischer presents for an annual well visit and physical exam.  Well-appearing 72 y.o. male with dementia, hypertension, high cholesterol At his last visit, he was supposed have his annual wellness visit but he was having acute pain, bruising and swelling of the left foot and ankle. He was sent for xrays and 2 acute fractures of the left ankle were identified. Seen by orthopedic and is now wearing a boot but his pain is much improved and usually only takes tylenol or ibuprofen as needed.  Labs: due for labs New or worsening pain: none, seems to be doing better Other concerns: more difficult with agitated behavior at night without the alprazolam. Patient does not sleep well. --discussed getting home health order including physical therapy and speech therapy. Son states that the patient cannot really follow directions very well with PT or speech. Also himself and his siblings are managing well to care for the patient for now but he will reach out if they need further assistance at home.       07/18/2022   11:03 AM 06/26/2022    2:46 PM 06/13/2021    9:24 AM  MMSE - Mini Mental State Exam  Not completed: Unable to complete Unable to complete Unable to complete    Functional Status Survey: Is the patient deaf or have difficulty hearing?: Yes Does the patient have difficulty seeing, even when wearing glasses/contacts?: No Does the patient have difficulty concentrating, remembering, or making decisions?: Yes Does the patient have difficulty walking or climbing stairs?: Yes Does the patient have difficulty dressing or bathing?: Yes Does the patient have difficulty doing errands alone  such as visiting a doctor's office or shopping?: Yes     06/13/2021    9:23 AM 10/25/2021    8:31 AM 03/04/2022    2:39 PM 06/26/2022    2:51 PM 07/18/2022   11:02 AM  [place] in the past year? '1 1  1 1  '$ Was there an injury with Fall? 0 0 0 0 1  Fall Risk Category Calculator '2 2  2 3  '$ Fall Risk Category (Retired) Moderate Moderate     (RETIRED) Patient Fall Risk Level Moderate fall risk Moderate fall risk Moderate fall risk    Patient at Risk for Falls Due to Impaired mobility;Impaired balance/gait Impaired balance/gait;Impaired mobility     Fall risk Follow up Falls evaluation completed Falls evaluation completed Falls evaluation completed Falls evaluation completed Falls evaluation completed       07/18/2022   11:02 AM  Depression screen PHQ 2/9  Decreased Interest 0  Down, Depressed, Hopeless 0  PHQ - 2 Score 0        No data to display            Current Medication: Outpatient Encounter Medications as of 07/18/2022  Medication Sig   ALPRAZolam (XANAX) 0.5 MG tablet Take 1 tablet (0.5 mg total) by mouth 2 (two) times daily as needed for anxiety.   aspirin EC 81 MG tablet Take 81 mg by mouth daily.   atorvastatin (LIPITOR) 40 MG tablet TAKE 1 TABLET EVERY DAY   lisinopril (ZESTRIL) 10 MG tablet TAKE 1 TABLET EVERY DAY  melatonin 5 MG TABS Take 5 mg by mouth at bedtime as needed.   meloxicam (MOBIC) 7.5 MG tablet TAKE 1 TABLET(7.5 MG) BY MOUTH IN THE MORNING AND AT BEDTIME   memantine (NAMENDA) 5 MG tablet TAKE 2 TABLETS TWICE DAILY   metoprolol succinate (TOPROL-XL) 25 MG 24 hr tablet TAKE 1 TABLET EVERY DAY   mirtazapine (REMERON) 7.5 MG tablet Take 1 tablet (7.5 mg total) by mouth at bedtime.   QUEtiapine (SEROQUEL) 25 MG tablet Take 1 tablet (25 mg total) by mouth at bedtime. Start with a 1/2 tablet at night for the first few days.   risperiDONE (RISPERDAL) 1 MG tablet TAKE 1 TABLET(1 MG) BY MOUTH AT BEDTIME   tamsulosin (FLOMAX) 0.4 MG CAPS capsule TAKE 2  CAPSULES (0.8 MG TOTAL) BY MOUTH DAILY. (DOSE INCREASE)   traMADol (ULTRAM) 50 MG tablet Take 1 tablet (50 mg total) by mouth every 6 (six) hours as needed for moderate pain or severe pain. May give 1/2 tablet dose   Vitamin D, Ergocalciferol, (DRISDOL) 1.25 MG (50000 UNIT) CAPS capsule TAKE 1 CAPSULE BY MOUTH EVERY 7 DAYS   [DISCONTINUED] Tdap (ADACEL) 09-18-13.5 LF-MCG/0.5 injection Inject 0.5 mLs into the muscle once.   [DISCONTINUED] Zoster Vaccine Adjuvanted [facility]) injection Inject 0.5 mLs into the muscle once.   Tdap (ADACEL) 09-18-13.5 LF-MCG/0.5 injection Inject 0.5 mLs into the muscle once for 1 dose.   Zoster Vaccine Adjuvanted [facility]) injection Inject 0.5 mLs into the muscle once for 1 dose.   No facility-administered encounter medications on file as of 07/18/2022.    Surgical History: Past Surgical History:  Procedure Laterality Date   CAROTID ENDARTERECTOMY  01/02/2012    Medical History: Past Medical History:  Diagnosis Date   Carotid artery occlusion    Carotid stenosis    Dementia ([place])    Hyperlipidemia    Hypertension    Stroke ([place])    Hemorrhagic stroke    Family History: Family History  Problem Relation Age of Onset   Cancer Mother    Kidney disease Father     Social History   Socioeconomic History   Marital status: Unknown    Spouse name: Not on file   Number of children: Not on file   Years of education: Not on file   Highest education level: Not on file  Occupational History   Not on file  Tobacco Use   Smoking status: Former   Smokeless tobacco: Never  Vaping Use   Vaping Use: Never used  Substance and Sexual Activity   Alcohol use: No   Drug use: No   Sexual activity: Not on file  Other Topics Concern   Not on file  Social History Narrative   Not on file   Social Determinants of Health   Financial Resource Strain: Not on file  Food Insecurity: Not on file  Transportation Needs: Not on file  Physical Activity: Not on file   Stress: Not on file  Social Connections: Not on file  Intimate Partner Violence: Not on file      Review of Systems  Constitutional: Negative.  Negative for activity change, appetite change, chills, fatigue, fever and unexpected weight change.  HENT: Negative.  Negative for congestion, ear pain, rhinorrhea, sore throat and trouble swallowing.   Eyes: Negative.   Respiratory: Negative.  Negative for cough, chest tightness, shortness of breath and wheezing.   Cardiovascular: Negative.  Negative for chest pain and palpitations.  Gastrointestinal: Negative.  Negative for abdominal pain, blood in  stool, constipation, diarrhea, nausea and vomiting.  Endocrine: Negative.   Genitourinary:  Negative for difficulty urinating, dysuria, frequency, hematuria and urgency.       Incontinence  Musculoskeletal:  Positive for arthralgias and gait problem. Negative for back pain, joint swelling, myalgias and neck pain.       Hip pain chronic, not bothering her right now  Skin: Negative.  Negative for rash and wound.  Allergic/Immunologic: Negative.  Negative for immunocompromised state.  Neurological:  Positive for weakness. Negative for dizziness, seizures, numbness and headaches.  Hematological: Negative.   Psychiatric/Behavioral:  Positive for agitation, behavioral problems, confusion and sleep disturbance. Negative for self-injury and suicidal ideas. The patient is nervous/anxious.     Vital Signs: BP 130/76   Pulse 73   Temp 97.7 F (36.5 C)   Resp 16   Ht '5\' 3"'$  (1.6 m)   SpO2 98%   BMI 23.91 kg/m    Physical Exam Vitals reviewed.  Constitutional:      General: He is awake. He is not in acute distress.    Appearance: Normal appearance. He is well-developed, well-groomed and normal weight. He is not ill-appearing or diaphoretic.  HENT:     Head: Normocephalic and atraumatic.     Nose: Nose normal.     Mouth/Throat:     Lips: Pink.     Mouth: Mucous membranes are moist.      Pharynx: Oropharynx is clear. Uvula midline.  Eyes:     General: Lids are normal. Vision grossly intact. Gaze aligned appropriately.     Pupils: Pupils are equal, round, and reactive to light.     Funduscopic exam:    Right eye: Red reflex present.        Left eye: Red reflex present. Neck:     Thyroid: No thyromegaly.     Vascular: No carotid bruit or JVD.     Trachea: Trachea and phonation normal. No tracheal deviation.  Cardiovascular:     Rate and Rhythm: Normal rate and regular rhythm.     Pulses: Normal pulses.     Heart sounds: Normal heart sounds, S1 normal and S2 normal. No murmur heard.    No friction rub. No gallop.  Pulmonary:     Effort: Pulmonary effort is normal. No accessory muscle usage or respiratory distress.     Breath sounds: Normal breath sounds and air entry. No stridor. No wheezing or rales.  Chest:     Chest wall: No tenderness.  Abdominal:     General: Bowel sounds are normal. There is no distension.     Palpations: Abdomen is soft. There is no mass.     Tenderness: There is no abdominal tenderness. There is no guarding or rebound.  Musculoskeletal:        General: No tenderness or deformity. Normal range of motion.     Cervical back: Normal range of motion and neck supple.     Right lower leg: No edema.     Left lower leg: No edema.     Comments: Orthopedic boot on left foot for ankle fracture.  Lymphadenopathy:     Cervical: No cervical adenopathy.  Skin:    General: Skin is warm and dry.     Capillary Refill: Capillary refill takes less than 2 seconds.     Coloration: Skin is not pale.     Findings: No erythema or rash.  Neurological:     Mental Status: He is alert. He is disoriented and confused.  Cranial Nerves: No cranial nerve deficit.     Motor: Weakness present. No abnormal muscle tone.     Coordination: Coordination abnormal.     Gait: Gait abnormal.     Deep Tendon Reflexes: Reflexes are normal and symmetric.     Comments: In  wheelchair  Psychiatric:        Mood and Affect: Mood normal. Affect is blunt.        Behavior: Behavior is agitated. Behavior is cooperative.        Thought Content: Thought content normal.        Cognition and Memory: Memory is impaired.        Judgment: Judgment is impulsive.        Assessment/Plan: 1. Encounter for routine adult health examination with abnormal findings Age-appropriate preventive screenings and vaccinations discussed, annual physical exam completed. Routine labs for health maintenance ordered, see below. PHM updated. Routine vaccines ordered - Tdap (ADACEL) 09-18-13.5 LF-MCG/0.5 injection; Inject 0.5 mLs into the muscle once for 1 dose.  Dispense: 0.5 mL; Refill: 0 - Zoster Vaccine Adjuvanted [facility]) injection; Inject 0.5 mLs into the muscle once for 1 dose.  Dispense: 0.5 mL; Refill: 0 - CBC with Differential/Platelet - CMP14+EGFR - Lipid Profile  2. Closed fracture of left ankle with routine healing, subsequent encounter Seen by orthopedic, wearing orthopedic boot. Will follow up with ortho  3. Essential hypertension Stable, routine labs ordered - CBC with Differential/Platelet - CMP14+EGFR - Lipid Profile  4. Mixed hyperlipidemia Routine labs ordered - CBC with Differential/Platelet - CMP14+EGFR - Lipid Profile  5. Screening for prostate cancer Routine PSA lab ordered - PSA Total (Reflex To Free)  6. Severe vascular dementia with agitation (HCC) Start seroquel as prescribed. Stop alprazolam - QUEtiapine (SEROQUEL) 25 MG tablet; Take 1 tablet (25 mg total) by mouth at bedtime. Start with a 1/2 tablet at night for the first few days.  Dispense: 30 tablet; Refill: 2      General Counseling: Keenen verbalizes understanding of the findings of todays visit and agrees with plan of treatment. I have discussed any further diagnostic evaluation that may be needed or ordered today. We also reviewed his medications today. he has been encouraged to call  the office with any questions or concerns that should arise related to todays visit.    Orders Placed This Encounter  Procedures   CBC with Differential/Platelet   CMP14+EGFR   Lipid Profile   PSA Total (Reflex To Free)    Meds ordered this encounter  Medications   Tdap (ADACEL) 09-18-13.5 LF-MCG/0.5 injection    Sig: Inject 0.5 mLs into the muscle once for 1 dose.    Dispense:  0.5 mL    Refill:  0   Zoster Vaccine Adjuvanted [facility]) injection    Sig: Inject 0.5 mLs into the muscle once for 1 dose.    Dispense:  0.5 mL    Refill:  0   QUEtiapine (SEROQUEL) 25 MG tablet    Sig: Take 1 tablet (25 mg total) by mouth at bedtime. Start with a 1/2 tablet at night for the first few days.    Dispense:  30 tablet    Refill:  2    Return in about 3 months (around 10/16/2022) for F/U, Toney Difatta PCP may do virtual .   Total time spent:30 Minutes Time spent includes review of chart, medications, test results, and follow up plan with the patient.   [place] Controlled Substance Database was reviewed by me.  This patient was  seen by Jonetta Osgood, FNP-C in collaboration with Dr. Clayborn Bigness as a part of collaborative care agreement.  Tonique Mendonca R. Valetta Fuller, MSN, FNP-C Internal medicine

## 2022-07-19 ENCOUNTER — Other Ambulatory Visit: Payer: Self-pay

## 2022-07-19 ENCOUNTER — Telehealth: Payer: Self-pay

## 2022-07-19 DIAGNOSIS — S8292XA Unspecified fracture of left lower leg, initial encounter for closed fracture: Secondary | ICD-10-CM | POA: Diagnosis not present

## 2022-07-19 DIAGNOSIS — F01C11 Vascular dementia, severe, with agitation: Secondary | ICD-10-CM

## 2022-07-19 MED ORDER — QUETIAPINE FUMARATE 25 MG PO TABS: 25.0000 mg | ORAL_TABLET | Freq: Every day | ORAL | 2 refills | Status: DC

## 2022-07-19 NOTE — Telephone Encounter (Signed)
Resent patient's Seroquel to [facility] due to it being cheaper than [facility].

## 2022-08-23 DIAGNOSIS — S8292XD Unspecified fracture of left lower leg, subsequent encounter for closed fracture with routine healing: Secondary | ICD-10-CM | POA: Diagnosis not present

## 2022-08-23 DIAGNOSIS — S82899A Other fracture of unspecified lower leg, initial encounter for closed fracture: Secondary | ICD-10-CM | POA: Insufficient documentation

## 2022-08-29 ENCOUNTER — Other Ambulatory Visit: Payer: Self-pay | Admitting: Nurse Practitioner

## 2022-08-29 DIAGNOSIS — F01C11 Vascular dementia, severe, with agitation: Secondary | ICD-10-CM

## 2022-09-02 ENCOUNTER — Other Ambulatory Visit: Payer: Self-pay | Admitting: Nurse Practitioner

## 2022-10-03 DIAGNOSIS — E559 Vitamin D deficiency, unspecified: Secondary | ICD-10-CM | POA: Diagnosis not present

## 2022-10-03 DIAGNOSIS — E782 Mixed hyperlipidemia: Secondary | ICD-10-CM | POA: Diagnosis not present

## 2022-10-03 DIAGNOSIS — I10 Essential (primary) hypertension: Secondary | ICD-10-CM | POA: Diagnosis not present

## 2022-10-03 DIAGNOSIS — Z0001 Encounter for general adult medical examination with abnormal findings: Secondary | ICD-10-CM | POA: Diagnosis not present

## 2022-10-03 DIAGNOSIS — M25472 Effusion, left ankle: Secondary | ICD-10-CM | POA: Diagnosis not present

## 2022-10-03 DIAGNOSIS — M25572 Pain in left ankle and joints of left foot: Secondary | ICD-10-CM | POA: Diagnosis not present

## 2022-10-03 DIAGNOSIS — Z125 Encounter for screening for malignant neoplasm of prostate: Secondary | ICD-10-CM | POA: Diagnosis not present

## 2022-10-04 LAB — CBC WITH DIFFERENTIAL/PLATELET
Basophils Absolute: 0.1 10*3/uL (ref 0.0–0.2)
Basos: 1 %
EOS (ABSOLUTE): 1.9 10*3/uL — ABNORMAL HIGH (ref 0.0–0.4)
Eos: 15 %
Hematocrit: 40.2 % (ref 37.5–51.0)
Hemoglobin: 13.7 g/dL (ref 13.0–17.7)
Immature Grans (Abs): 0 10*3/uL (ref 0.0–0.1)
Immature Granulocytes: 0 %
Lymphocytes Absolute: 1.8 10*3/uL (ref 0.7–3.1)
Lymphs: 14 %
MCH: 31.6 pg (ref 26.6–33.0)
MCHC: 34.1 g/dL (ref 31.5–35.7)
MCV: 93 fL (ref 79–97)
Monocytes Absolute: 0.3 10*3/uL (ref 0.1–0.9)
Monocytes: 2 %
Neutrophils Absolute: 8.5 10*3/uL — ABNORMAL HIGH (ref 1.4–7.0)
Neutrophils: 68 %
Platelets: 222 10*3/uL (ref 150–450)
RBC: 4.33 x10E6/uL (ref 4.14–5.80)
RDW: 12.3 % (ref 11.6–15.4)
WBC: 12.6 10*3/uL — ABNORMAL HIGH (ref 3.4–10.8)

## 2022-10-04 LAB — LIPID PANEL
Chol/HDL Ratio: 2.7 ratio (ref 0.0–5.0)
Cholesterol, Total: 104 mg/dL (ref 100–199)
HDL: 38 mg/dL — ABNORMAL LOW (ref >39)
LDL Chol Calc (NIH): 46 mg/dL (ref 0–99)
Triglycerides: 106 mg/dL (ref 0–149)
VLDL Cholesterol Cal: 20 mg/dL (ref 5–40)

## 2022-10-04 LAB — CMP14+EGFR
ALT: 60 IU/L — ABNORMAL HIGH (ref 0–44)
AST: 29 IU/L (ref 0–40)
Albumin/Globulin Ratio: 1.5 (ref 1.2–2.2)
Albumin: 4.2 g/dL (ref 3.8–4.8)
Alkaline Phosphatase: 160 IU/L — ABNORMAL HIGH (ref 44–121)
BUN/Creatinine Ratio: 19 (ref 10–24)
BUN: 9 mg/dL (ref 8–27)
Bilirubin Total: 0.7 mg/dL (ref 0.0–1.2)
CO2: 23 mmol/L (ref 20–29)
Calcium: 9.1 mg/dL (ref 8.6–10.2)
Chloride: 102 mmol/L (ref 96–106)
Creatinine, Ser: 0.47 mg/dL — ABNORMAL LOW (ref 0.76–1.27)
Globulin, Total: 2.8 g/dL (ref 1.5–4.5)
Glucose: 100 mg/dL — ABNORMAL HIGH (ref 70–99)
Potassium: 3.7 mmol/L (ref 3.5–5.2)
Sodium: 139 mmol/L (ref 134–144)
Total Protein: 7 g/dL (ref 6.0–8.5)
eGFR: 109 mL/min/{1.73_m2} (ref >59)

## 2022-10-04 LAB — PSA TOTAL (REFLEX TO FREE): Prostate Specific Ag, Serum: 0.9 ng/mL (ref 0.0–4.0)

## 2022-10-07 ENCOUNTER — Other Ambulatory Visit: Payer: Self-pay | Admitting: Nurse Practitioner

## 2022-10-07 DIAGNOSIS — R630 Anorexia: Secondary | ICD-10-CM

## 2022-10-07 DIAGNOSIS — F01C11 Vascular dementia, severe, with agitation: Secondary | ICD-10-CM

## 2022-10-16 ENCOUNTER — Ambulatory Visit (INDEPENDENT_AMBULATORY_CARE_PROVIDER_SITE_OTHER): Payer: Medicare HMO | Admitting: Nurse Practitioner

## 2022-10-16 ENCOUNTER — Encounter: Payer: Self-pay | Admitting: Nurse Practitioner

## 2022-10-16 VITALS — BP 120/60 | HR 73 | Temp 98.7°F | Resp 16 | Ht 63.0 in | Wt 130.0 lb

## 2022-10-16 DIAGNOSIS — S82892D Other fracture of left lower leg, subsequent encounter for closed fracture with routine healing: Secondary | ICD-10-CM | POA: Diagnosis not present

## 2022-10-16 DIAGNOSIS — F01C11 Vascular dementia, severe, with agitation: Secondary | ICD-10-CM

## 2022-10-16 DIAGNOSIS — I10 Essential (primary) hypertension: Secondary | ICD-10-CM

## 2022-10-16 MED ORDER — QUETIAPINE FUMARATE 25 MG PO TABS: 25.0000 mg | ORAL_TABLET | Freq: Every day | ORAL | 2 refills | Status: DC

## 2022-10-16 NOTE — Progress Notes (Signed)
[facility] [address] [place], [place] [address]  Internal MEDICINE  Office Visit Note  Patient Name: Carlos Fischer  [id number]  [id number]  Date of Service: 10/16/2022  Chief Complaint  Patient presents with   Hyperlipidemia   Hypertension   Follow-up    HPI Cuauhtemoc presents for a follow-up visit for dementia, insomnia and med refill.  Dementia -- seroquel was added and he has been doing well with the medication,  Insomnia -- was taken off alprazolam and switched to seroquel which has been helping with sleep. He is taking a half tablet at bed time.  Left ankle fracture -- still following up with orthopedic and taking a calcium supplement.  Hypertension -- controlled with current medication     Current Medication: Outpatient Encounter Medications as of 10/16/2022  Medication Sig   aspirin EC 81 MG tablet Take 81 mg by mouth daily.   atorvastatin (LIPITOR) 40 MG tablet TAKE 1 TABLET EVERY DAY   lisinopril (ZESTRIL) 10 MG tablet TAKE 1 TABLET EVERY DAY   melatonin 5 MG TABS Take 5 mg by mouth at bedtime as needed.   meloxicam (MOBIC) 7.5 MG tablet TAKE 1 TABLET(7.5 MG) BY MOUTH IN THE MORNING AND AT BEDTIME   memantine (NAMENDA) 5 MG tablet TAKE 2 TABLETS TWICE DAILY   metoprolol succinate (TOPROL-XL) 25 MG 24 hr tablet TAKE 1 TABLET EVERY DAY   mirtazapine (REMERON) 7.5 MG tablet TAKE 1 TABLET AT BEDTIME   risperiDONE (RISPERDAL) 1 MG tablet TAKE 2 TABLETS(2 MG) BY MOUTH AT BEDTIME   tamsulosin (FLOMAX) 0.4 MG CAPS capsule TAKE 2 CAPSULES (0.8 MG TOTAL) BY MOUTH DAILY. (DOSE INCREASE)   traMADol (ULTRAM) 50 MG tablet Take 1 tablet (50 mg total) by mouth every 6 (six) hours as needed for moderate pain or severe pain. May give 1/2 tablet dose   Vitamin D, Ergocalciferol, (DRISDOL) 1.25 MG (50000 UNIT) CAPS capsule TAKE 1 CAPSULE BY MOUTH EVERY 7 DAYS   [DISCONTINUED] QUEtiapine (SEROQUEL) 25 MG tablet Take 1 tablet (25 mg total) by mouth at  bedtime. Start with a 1/2 tablet at night for the first few days.   QUEtiapine (SEROQUEL) 25 MG tablet Take 1 tablet (25 mg total) by mouth at bedtime. Start with a 1/2 tablet at night for the first few days.   No facility-administered encounter medications on file as of 10/16/2022.    Surgical History: Past Surgical History:  Procedure Laterality Date   CAROTID ENDARTERECTOMY  01/02/2012    Medical History: Past Medical History:  Diagnosis Date   Carotid artery occlusion    Carotid stenosis    Dementia (HCC)    Hyperlipidemia    Hypertension    Stroke (HCC)    Hemorrhagic stroke    Family History: Family History  Problem Relation Age of Onset   Cancer Mother    Kidney disease Father     Social History   Socioeconomic History   Marital status: Unknown    Spouse name: Not on file   Number of children: Not on file   Years of education: Not on file   Highest education level: Not on file  Occupational History   Not on file  Tobacco Use   Smoking status: Former   Smokeless tobacco: Never  Vaping Use   Vaping Use: Never used  Substance and Sexual Activity   Alcohol use: No   Drug use: No   Sexual activity: Not on file  Other Topics Concern  Not on file  Social History Narrative   Not on file   Social Determinants of Health   Financial Resource Strain: Not on file  Food Insecurity: Not on file  Transportation Needs: Not on file  Physical Activity: Not on file  Stress: Not on file  Social Connections: Not on file  Intimate Partner Violence: Not on file      Review of Systems  Constitutional: Negative.  Negative for activity change, appetite change, chills, fatigue, fever and unexpected weight change.  HENT: Negative.  Negative for congestion, ear pain, rhinorrhea, sore throat and trouble swallowing.   Eyes: Negative.   Respiratory: Negative.  Negative for cough, chest tightness, shortness of breath and wheezing.   Cardiovascular: Negative.  Negative  for chest pain and palpitations.  Gastrointestinal: Negative.  Negative for abdominal pain, blood in stool, constipation, diarrhea, nausea and vomiting.  Endocrine: Negative.   Genitourinary:  Negative for difficulty urinating, dysuria, frequency, hematuria and urgency.       Incontinence  Musculoskeletal:  Positive for arthralgias and gait problem. Negative for back pain, joint swelling, myalgias and neck pain.       Hip pain chronic, not bothering her right now  Skin: Negative.  Negative for rash and wound.  Allergic/Immunologic: Negative.  Negative for immunocompromised state.  Neurological:  Positive for weakness. Negative for dizziness, seizures, numbness and headaches.  Hematological: Negative.   Psychiatric/Behavioral:  Positive for agitation, behavioral problems, confusion and sleep disturbance. Negative for self-injury and suicidal ideas. The patient is nervous/anxious.     Vital Signs: BP 120/60   Pulse 73   Temp 98.7 F (37.1 C)   Resp 16   Ht 5\' 3"  (1.6 m)   Wt 130 lb (59 kg)   SpO2 97%   BMI 23.03 kg/m    Physical Exam Vitals reviewed.  Constitutional:      General: He is awake. He is not in acute distress.    Appearance: Normal appearance. He is well-developed, well-groomed and normal weight. He is not ill-appearing.  HENT:     Head: Normocephalic and atraumatic.  Eyes:     Pupils: Pupils are equal, round, and reactive to light.  Cardiovascular:     Rate and Rhythm: Normal rate and regular rhythm.  Pulmonary:     Effort: Pulmonary effort is normal. No respiratory distress.  Neurological:     Mental Status: He is alert. He is disoriented and confused.     Motor: Weakness present.     Coordination: Coordination abnormal.     Gait: Gait abnormal.  Psychiatric:        Mood and Affect: Mood normal. Affect is blunt and flat.        Speech: Speech is delayed.        Behavior: Behavior is agitated (as the day progresses), aggressive (at night, he becomes  aggressive, biting, kicking, hitting and fighting) and combative (at night). Behavior is cooperative.        Thought Content: Thought content is not paranoid. Thought content does not include homicidal or suicidal ideation.        Cognition and Memory: Cognition is impaired. Memory is impaired.        Judgment: Judgment is impulsive.     Comments: Behavior has improved some since starting seroquel        Assessment/Plan: 1. Essential hypertension Stable, continue medications as prescribed.   2. Closed fracture of left ankle with routine healing, subsequent encounter Continue following up with orthopedic for recovery  3. Severe vascular dementia with agitation (HCC) Continue seroquel as prescribed  - QUEtiapine (SEROQUEL) 25 MG tablet; Take 1 tablet (25 mg total) by mouth at bedtime. Start with a 1/2 tablet at night for the first few days.  Dispense: 30 tablet; Refill: 2   General Counseling: Quay verbalizes understanding of the findings of todays visit and agrees with plan of treatment. I have discussed any further diagnostic evaluation that may be needed or ordered today. We also reviewed his medications today. he has been encouraged to call the office with any questions or concerns that should arise related to todays visit.    No orders of the defined types were placed in this encounter.   Meds ordered this encounter  Medications   QUEtiapine (SEROQUEL) 25 MG tablet    Sig: Take 1 tablet (25 mg total) by mouth at bedtime. Start with a 1/2 tablet at night for the first few days.    Dispense:  30 tablet    Refill:  2    Return in about 3 months (around 01/16/2023) for F/U, Rylie Knierim PCP in person or virtual, either is ok. .   Total time spent:30 Minutes Time spent includes review of chart, medications, test results, and follow up plan with the patient.   [place] Controlled Substance Database was reviewed by me.  This patient was seen by Sallyanne Kuster, FNP-C in  collaboration with Dr. Beverely Risen as a part of collaborative care agreement.   Harshaan Whang R. Tedd Sias, MSN, FNP-C Internal medicine

## 2022-10-22 ENCOUNTER — Other Ambulatory Visit: Payer: Self-pay | Admitting: Nurse Practitioner

## 2022-10-22 DIAGNOSIS — F01C11 Vascular dementia, severe, with agitation: Secondary | ICD-10-CM

## 2022-12-04 ENCOUNTER — Encounter: Payer: Self-pay | Admitting: Nurse Practitioner

## 2022-12-04 ENCOUNTER — Ambulatory Visit (INDEPENDENT_AMBULATORY_CARE_PROVIDER_SITE_OTHER): Payer: Medicare HMO | Admitting: Nurse Practitioner

## 2022-12-04 VITALS — BP 84/53 | HR 72 | Temp 98.0°F | Resp 16 | Ht 63.0 in

## 2022-12-04 DIAGNOSIS — R569 Unspecified convulsions: Secondary | ICD-10-CM

## 2022-12-04 DIAGNOSIS — R630 Anorexia: Secondary | ICD-10-CM | POA: Diagnosis not present

## 2022-12-04 DIAGNOSIS — R5381 Other malaise: Secondary | ICD-10-CM

## 2022-12-04 MED ORDER — MIRTAZAPINE 15 MG PO TABS: 15.0000 mg | ORAL_TABLET | Freq: Every day | ORAL | 1 refills | Status: DC

## 2022-12-04 MED ORDER — LEVETIRACETAM 500 MG PO TABS: 500.0000 mg | ORAL_TABLET | Freq: Two times a day (BID) | ORAL | 5 refills | Status: DC

## 2022-12-04 NOTE — Progress Notes (Signed)
[facility] [address] [place], [place] [address]  Internal MEDICINE  Office Visit Note  Patient Name: Carlos Fischer  [id number]  [id number]  Date of Service: 12/04/2022  Chief Complaint  Patient presents with   Acute Visit    Seizures went to [place] June 24 did go to hospital.      HPI Carlos Fischer presents for an acute sick visit for seizures Had 2 seizures while in [place], happened on 6/24, went to ER and was put on keppra 500 mg twice daily which has helped to prevent further seizures.  Has been more emotional since then, not eating as much, not drinking as much Pocketing foods Lost 5 more lbs since last office visit, son asking about when we need to discuss a g-tube.    Current Medication:  Outpatient Encounter Medications as of 12/04/2022  Medication Sig   aspirin EC 81 MG tablet Take 81 mg by mouth daily.   atorvastatin (LIPITOR) 40 MG tablet TAKE 1 TABLET EVERY DAY   levETIRAcetam (KEPPRA) 500 MG tablet Take 1 tablet (500 mg total) by mouth 2 (two) times daily.   lisinopril (ZESTRIL) 10 MG tablet TAKE 1 TABLET EVERY DAY   melatonin 5 MG TABS Take 5 mg by mouth at bedtime as needed.   meloxicam (MOBIC) 7.5 MG tablet TAKE 1 TABLET(7.5 MG) BY MOUTH IN THE MORNING AND AT BEDTIME   memantine (NAMENDA) 5 MG tablet TAKE 2 TABLETS TWICE DAILY   metoprolol succinate (TOPROL-XL) 25 MG 24 hr tablet TAKE 1 TABLET EVERY DAY   mirtazapine (REMERON) 15 MG tablet Take 1 tablet (15 mg total) by mouth at bedtime.   QUEtiapine (SEROQUEL) 25 MG tablet Take 1 tablet (25 mg total) by mouth at bedtime. Start with a 1/2 tablet at night for the first few days.   risperiDONE (RISPERDAL) 1 MG tablet TAKE 2 TABLETS(2 MG) BY MOUTH AT BEDTIME   tamsulosin (FLOMAX) 0.4 MG CAPS capsule TAKE 2 CAPSULES (0.8 MG TOTAL) BY MOUTH DAILY. (DOSE INCREASE)   traMADol (ULTRAM) 50 MG tablet Take 1 tablet (50 mg total) by mouth every 6 (six) hours as needed for moderate pain or  severe pain. May give 1/2 tablet dose   Vitamin D, Ergocalciferol, (DRISDOL) 1.25 MG (50000 UNIT) CAPS capsule TAKE 1 CAPSULE BY MOUTH EVERY 7 DAYS   [DISCONTINUED] mirtazapine (REMERON) 7.5 MG tablet TAKE 1 TABLET AT BEDTIME   No facility-administered encounter medications on file as of 12/04/2022.      Medical History: Past Medical History:  Diagnosis Date   Carotid artery occlusion    Carotid stenosis    Dementia (HCC)    Hyperlipidemia    Hypertension    Stroke (HCC)    Hemorrhagic stroke     Vital Signs: BP (!) 84/53   Pulse 72   Temp 98 F (36.7 C)   Resp 16   Ht 5\' 3"  (1.6 m)   SpO2 95%   BMI 23.03 kg/m    Review of Systems  Constitutional:  Positive for activity change, appetite change and unexpected weight change. Negative for chills, fatigue and fever.  HENT: Negative.  Negative for congestion, ear pain, rhinorrhea, sore throat and trouble swallowing.   Eyes: Negative.   Respiratory:  Positive for wheezing. Negative for cough, chest tightness and shortness of breath.   Cardiovascular: Negative.  Negative for chest pain and palpitations.  Gastrointestinal: Negative.  Negative for abdominal pain, blood in stool, constipation, diarrhea, nausea and vomiting.  Endocrine: Negative.  Genitourinary:  Negative for difficulty urinating, dysuria, frequency, hematuria and urgency.       Incontinence  Musculoskeletal:  Positive for arthralgias and gait problem. Negative for back pain, joint swelling, myalgias and neck pain.       Hip pain chronic, not bothering her right now  Skin: Negative.  Negative for rash and wound.  Allergic/Immunologic: Negative.  Negative for immunocompromised state.  Neurological:  Positive for seizures and weakness. Negative for dizziness, numbness and headaches.  Hematological: Negative.   Psychiatric/Behavioral:  Positive for agitation, behavioral problems, confusion and sleep disturbance. Negative for self-injury and suicidal ideas. The  patient is nervous/anxious.     Physical Exam Vitals reviewed.  Constitutional:      General: He is awake. He is not in acute distress.    Appearance: Normal appearance. He is well-developed, well-groomed and normal weight. He is not ill-appearing.  HENT:     Head: Normocephalic and atraumatic.  Eyes:     Pupils: Pupils are equal, round, and reactive to light.  Cardiovascular:     Rate and Rhythm: Normal rate and regular rhythm.  Pulmonary:     Effort: Pulmonary effort is normal. No respiratory distress.  Neurological:     Mental Status: He is alert. He is disoriented and confused.     Motor: Weakness present.     Coordination: Coordination abnormal.     Gait: Gait abnormal.  Psychiatric:        Mood and Affect: Mood normal. Affect is blunt and flat.        Speech: Speech is delayed.        Behavior: Behavior is agitated (as the day progresses), aggressive (at night, he becomes aggressive, biting, kicking, hitting and fighting) and combative (at night). Behavior is cooperative.        Thought Content: Thought content is not paranoid. Thought content does not include homicidal or suicidal ideation.        Cognition and Memory: Cognition is impaired. Memory is impaired.        Judgment: Judgment is impulsive.     Comments: Behavior has improved some since starting seroquel       Assessment/Plan: 1. Seizures (HCC) Continue keppra as prescribed. Will order keppra blood level at next office visit  - levETIRAcetam (KEPPRA) 500 MG tablet; Take 1 tablet (500 mg total) by mouth 2 (two) times daily.  Dispense: 60 tablet; Refill: 5  2. Poor appetite Increase mirtazapine dose to help stimulate his appetite. Will discuss at virtual follow up visit how his appetite is doing. If his appetite is still poor, will discuss next steps for possible evaluation for feeding tube.  - mirtazapine (REMERON) 15 MG tablet; Take 1 tablet (15 mg total) by mouth at bedtime.  Dispense: 90 tablet; Refill:  1  3. Declining functional status Increased mirtazapine dose, follow up in August for virtual visit to discuss patient's progress and next steps at that time.  - mirtazapine (REMERON) 15 MG tablet; Take 1 tablet (15 mg total) by mouth at bedtime.  Dispense: 90 tablet; Refill: 1   General Counseling: Carlos Fischer verbalizes understanding of the findings of todays visit and agrees with plan of treatment. I have discussed any further diagnostic evaluation that may be needed or ordered today. We also reviewed his medications today. he has been encouraged to call the office with any questions or concerns that should arise related to todays visit.    Counseling:    No orders of the defined types were placed  in this encounter.   Meds ordered this encounter  Medications   mirtazapine (REMERON) 15 MG tablet    Sig: Take 1 tablet (15 mg total) by mouth at bedtime.    Dispense:  90 tablet    Refill:  1    Note increased dose, discontinue 7.5 mg tablet.   levETIRAcetam (KEPPRA) 500 MG tablet    Sig: Take 1 tablet (500 mg total) by mouth 2 (two) times daily.    Dispense:  60 tablet    Refill:  5    Fill new script today    Return for previously scheduled, F/U, Lavaun Greenfield PCP on 8/28 virtual .  [place] Controlled Substance Database was reviewed by me for overdose risk score (ORS)  Time spent:30 Minutes Time spent with patient included reviewing progress notes, labs, imaging studies, and discussing plan for follow up.   This patient was seen by Sallyanne Kuster, FNP-C in collaboration with Dr. Beverely Risen as a part of collaborative care agreement.  Brenn Gatton R. Tedd Sias, MSN, FNP-C Internal Medicine

## 2022-12-06 ENCOUNTER — Encounter: Payer: Self-pay | Admitting: Nurse Practitioner

## 2022-12-24 ENCOUNTER — Other Ambulatory Visit: Payer: Self-pay | Admitting: Nurse Practitioner

## 2022-12-24 DIAGNOSIS — F01C11 Vascular dementia, severe, with agitation: Secondary | ICD-10-CM

## 2023-01-15 ENCOUNTER — Encounter: Payer: Self-pay | Admitting: Nurse Practitioner

## 2023-01-15 ENCOUNTER — Telehealth (INDEPENDENT_AMBULATORY_CARE_PROVIDER_SITE_OTHER): Payer: Medicare HMO | Admitting: Nurse Practitioner

## 2023-01-15 VITALS — Resp 16 | Ht 63.0 in | Wt 125.0 lb

## 2023-01-15 DIAGNOSIS — R569 Unspecified convulsions: Secondary | ICD-10-CM

## 2023-01-15 DIAGNOSIS — F01C11 Vascular dementia, severe, with agitation: Secondary | ICD-10-CM

## 2023-01-15 DIAGNOSIS — R630 Anorexia: Secondary | ICD-10-CM | POA: Diagnosis not present

## 2023-01-15 DIAGNOSIS — R5381 Other malaise: Secondary | ICD-10-CM | POA: Diagnosis not present

## 2023-01-15 NOTE — Progress Notes (Signed)
[facility] [address] [place], [place] [address]  Internal MEDICINE  Telephone Visit  Patient Name: Carlos Fischer  [id number]  [id number]  Date of Service: 01/15/2023  I connected with the patient at 1230 by telephone and verified the patients identity using two identifiers.   I discussed the limitations, risks, security and privacy concerns of performing an evaluation and management service by telephone and the availability of in person appointments. I also discussed with the patient that there may be a patient responsible charge related to the service.  The patient expressed understanding and agrees to proceed.    Chief Complaint  Patient presents with   Telephone Screen    3 month f/u   Telephone Assessment    HPI Carlos Fischer presents for a telehealth virtual visit for routine follow up Patient has dementia with cognitive impairment and behavioral disturbance so it is difficult to come in person for every office visit.  Seizures -- taking Keppra, has not had any new seizure activity.  Decreased appetite -- improving with increased dose of mirtazapine. Depressed mood and crying spells -- improved with increased mirtazapine Poor sleep -- improving with increased dose of mirtazapine.     Current Medication: Outpatient Encounter Medications as of 01/15/2023  Medication Sig   aspirin EC 81 MG tablet Take 81 mg by mouth daily.   atorvastatin (LIPITOR) 40 MG tablet TAKE 1 TABLET EVERY DAY   levETIRAcetam (KEPPRA) 500 MG tablet Take 1 tablet (500 mg total) by mouth 2 (two) times daily.   lisinopril (ZESTRIL) 10 MG tablet TAKE 1 TABLET EVERY DAY   melatonin 5 MG TABS Take 5 mg by mouth at bedtime as needed.   meloxicam (MOBIC) 7.5 MG tablet TAKE 1 TABLET(7.5 MG) BY MOUTH IN THE MORNING AND AT BEDTIME   memantine (NAMENDA) 5 MG tablet TAKE 2 TABLETS TWICE DAILY   metoprolol succinate (TOPROL-XL) 25 MG 24 hr tablet TAKE 1 TABLET EVERY DAY    mirtazapine (REMERON) 15 MG tablet Take 1 tablet (15 mg total) by mouth at bedtime.   QUEtiapine (SEROQUEL) 25 MG tablet TAKE 1 TABLET BY MOUTH AT BEDTIME START WITH ONE-HALF TABLET AT NIGHT FOR THE FIRST FEW DAYS   risperiDONE (RISPERDAL) 1 MG tablet TAKE 2 TABLETS(2 MG) BY MOUTH AT BEDTIME   tamsulosin (FLOMAX) 0.4 MG CAPS capsule TAKE 2 CAPSULES (0.8 MG TOTAL) BY MOUTH DAILY. (DOSE INCREASE)   traMADol (ULTRAM) 50 MG tablet Take 1 tablet (50 mg total) by mouth every 6 (six) hours as needed for moderate pain or severe pain. May give 1/2 tablet dose   Vitamin D, Ergocalciferol, (DRISDOL) 1.25 MG (50000 UNIT) CAPS capsule TAKE 1 CAPSULE BY MOUTH EVERY 7 DAYS   No facility-administered encounter medications on file as of 01/15/2023.    Surgical History: Past Surgical History:  Procedure Laterality Date   CAROTID ENDARTERECTOMY  01/02/2012    Medical History: Past Medical History:  Diagnosis Date   Carotid artery occlusion    Carotid stenosis    Dementia (HCC)    Hyperlipidemia    Hypertension    Stroke (HCC)    Hemorrhagic stroke    Family History: Family History  Problem Relation Age of Onset   Cancer Mother    Kidney disease Father     Social History   Socioeconomic History   Marital status: Unknown    Spouse name: Not on file   Number of children: Not on file   Years of education: Not on file  Highest education level: Not on file  Occupational History   Not on file  Tobacco Use   Smoking status: Former   Smokeless tobacco: Never  Vaping Use   Vaping status: Never Used  Substance and Sexual Activity   Alcohol use: No   Drug use: No   Sexual activity: Not on file  Other Topics Concern   Not on file  Social History Narrative   Not on file   Social Determinants of Health   Financial Resource Strain: Not on file  Food Insecurity: Not on file  Transportation Needs: Not on file  Physical Activity: Not on file  Stress: Not on file  Social Connections: Not  on file  Intimate Partner Violence: Not on file      Review of Systems  Constitutional:  Positive for activity change, appetite change and unexpected weight change. Negative for chills, fatigue and fever.  HENT: Negative.  Negative for congestion, ear pain, rhinorrhea, sore throat and trouble swallowing.   Eyes: Negative.   Respiratory:  Negative for cough, chest tightness, shortness of breath and wheezing.   Cardiovascular: Negative.  Negative for chest pain and palpitations.  Gastrointestinal: Negative.  Negative for abdominal pain, blood in stool, constipation, diarrhea, nausea and vomiting.  Endocrine: Negative.   Genitourinary:  Negative for difficulty urinating, dysuria, frequency, hematuria and urgency.       Incontinence  Musculoskeletal:  Positive for arthralgias and gait problem. Negative for back pain, joint swelling, myalgias and neck pain.       Hip pain chronic, not bothering her right now  Skin: Negative.  Negative for rash and wound.  Allergic/Immunologic: Negative.  Negative for immunocompromised state.  Neurological:  Positive for seizures and weakness. Negative for dizziness, numbness and headaches.  Hematological: Negative.   Psychiatric/Behavioral:  Positive for agitation, behavioral problems, confusion and sleep disturbance. Negative for self-injury and suicidal ideas. The patient is nervous/anxious.     Vital Signs: Resp 16   Ht 5\' 3"  (1.6 m)   Wt 125 lb (56.7 kg)   BMI 22.14 kg/m    Observation/Objective: He is at baseline, no acute distress noted.     Assessment/Plan: 1. Seizures (HCC) Continue keppra as prescribed.   2. Poor appetite Continue current dose of mirtazapine as prescribed.   3. Declining functional status Continue medications as prescribed.   4. Severe vascular dementia with agitation (HCC) Continue medications as prescribed.    General Counseling: Levester verbalizes understanding of the findings of today's phone visit and  agrees with plan of treatment. I have discussed any further diagnostic evaluation that may be needed or ordered today. We also reviewed his medications today. he has been encouraged to call the office with any questions or concerns that should arise related to todays visit.  Return in about 3 months (around 04/17/2023) for F/U, Ciarra Braddy PCP in person or virtual.   No orders of the defined types were placed in this encounter.   No orders of the defined types were placed in this encounter.   Time spent:20 Minutes Time spent with patient included reviewing progress notes, labs, imaging studies, and discussing plan for follow up.  [place] Controlled Substance Database was reviewed by me for overdose risk score (ORS) if appropriate.  This patient was seen by Sallyanne Kuster, FNP-C in collaboration with Dr. Beverely Risen as a part of collaborative care agreement.  Ajai Terhaar R. Tedd Sias, MSN, FNP-C Internal medicine

## 2023-02-03 ENCOUNTER — Telehealth: Payer: Self-pay

## 2023-02-03 ENCOUNTER — Ambulatory Visit: Payer: Medicare HMO | Admitting: Nurse Practitioner

## 2023-02-10 ENCOUNTER — Encounter: Payer: Self-pay | Admitting: Nurse Practitioner

## 2023-02-17 ENCOUNTER — Telehealth: Payer: Self-pay | Admitting: Nurse Practitioner

## 2023-02-17 NOTE — Telephone Encounter (Signed)
Notified son, letter for home assistance is ready to be p/u. Scanned-Toni

## 2023-02-17 NOTE — Telephone Encounter (Signed)
Carlos Fischer already call pt son

## 2023-03-02 ENCOUNTER — Other Ambulatory Visit: Payer: Self-pay | Admitting: Nurse Practitioner

## 2023-03-02 DIAGNOSIS — F01C11 Vascular dementia, severe, with agitation: Secondary | ICD-10-CM

## 2023-03-06 ENCOUNTER — Encounter: Payer: Self-pay | Admitting: Physician Assistant

## 2023-03-06 ENCOUNTER — Ambulatory Visit (INDEPENDENT_AMBULATORY_CARE_PROVIDER_SITE_OTHER): Payer: Medicare HMO | Admitting: Physician Assistant

## 2023-03-06 ENCOUNTER — Other Ambulatory Visit: Payer: Self-pay | Admitting: Nurse Practitioner

## 2023-03-06 VITALS — BP 105/62 | HR 71 | Temp 98.4°F | Resp 16 | Ht 63.0 in | Wt 130.0 lb

## 2023-03-06 DIAGNOSIS — H9222 Otorrhagia, left ear: Secondary | ICD-10-CM | POA: Diagnosis not present

## 2023-03-06 DIAGNOSIS — R569 Unspecified convulsions: Secondary | ICD-10-CM

## 2023-03-06 NOTE — Progress Notes (Signed)
[facility] [address] [place], [place] [address]  Internal MEDICINE  Office Visit Note  Patient Name: Carlos Fischer  [id number]  [id number]  Date of Service: 03/06/2023  Chief Complaint  Patient presents with   Acute Visit   Ear Pain    Some pain and bleeding in left ear, some headache     HPI Pt is here for a sick visit. -Pt has dementia and does not speak much, His son provides info -Pt had a fever 1.5 weeks ago, granddaughter had been sick around him, but this only last 1-2 days and has not had any fever since -Family noticed a little dried blood from left ear yesterday -pt did mention a headache, but otherwise does not say much -possible ear was cleaned at some point, but unclear when -pt does appear to feel some discomfort when ear manipulated on exam with otoscope. Unable to visualize entire TM to determine if any rupture  Current Medication:  Outpatient Encounter Medications as of 03/06/2023  Medication Sig   aspirin EC 81 MG tablet Take 81 mg by mouth daily.   atorvastatin (LIPITOR) 40 MG tablet TAKE 1 TABLET EVERY DAY   levETIRAcetam (KEPPRA) 500 MG tablet Take 1 tablet (500 mg total) by mouth 2 (two) times daily.   lisinopril (ZESTRIL) 10 MG tablet TAKE 1 TABLET EVERY DAY   melatonin 5 MG TABS Take 5 mg by mouth at bedtime as needed.   meloxicam (MOBIC) 7.5 MG tablet TAKE 1 TABLET(7.5 MG) BY MOUTH IN THE MORNING AND AT BEDTIME   memantine (NAMENDA) 5 MG tablet TAKE 2 TABLETS TWICE DAILY   metoprolol succinate (TOPROL-XL) 25 MG 24 hr tablet TAKE 1 TABLET EVERY DAY   mirtazapine (REMERON) 15 MG tablet Take 1 tablet (15 mg total) by mouth at bedtime.   QUEtiapine (SEROQUEL) 25 MG tablet TAKE 1 TABLET BY MOUTH AT BEDTIME START WITH ONE-HALF TABLET AT NIGHT FOR THE FIRST FEW DAYS   risperiDONE (RISPERDAL) 1 MG tablet TAKE 1 TABLET(1 MG) BY MOUTH AT BEDTIME   tamsulosin (FLOMAX) 0.4 MG CAPS capsule TAKE 2 CAPSULES (0.8 MG TOTAL) BY  MOUTH DAILY. (DOSE INCREASE)   traMADol (ULTRAM) 50 MG tablet Take 1 tablet (50 mg total) by mouth every 6 (six) hours as needed for moderate pain or severe pain. May give 1/2 tablet dose   Vitamin D, Ergocalciferol, (DRISDOL) 1.25 MG (50000 UNIT) CAPS capsule TAKE 1 CAPSULE BY MOUTH EVERY 7 DAYS   No facility-administered encounter medications on file as of 03/06/2023.      Medical History: Past Medical History:  Diagnosis Date   Carotid artery occlusion    Carotid stenosis    Dementia (HCC)    Hyperlipidemia    Hypertension    Stroke (HCC)    Hemorrhagic stroke     Vital Signs: BP 105/62   Pulse 71   Temp 98.4 F (36.9 C)   Resp 16   Ht 5\' 3"  (1.6 m)   Wt 130 lb (59 kg)   SpO2 97%   BMI 23.03 kg/m    Review of Systems  Constitutional:  Negative for fatigue and fever.  HENT:  Positive for ear discharge and ear pain. Negative for congestion, mouth sores and postnasal drip.        Bleeding from left ear  Respiratory:  Negative for cough.   Cardiovascular:  Negative for chest pain.  Genitourinary:  Negative for flank pain.  Neurological:  Positive for headaches.  Psychiatric/Behavioral: Negative.  Physical Exam Vitals and nursing note reviewed.  Constitutional:      Appearance: Normal appearance.  HENT:     Head: Normocephalic and atraumatic.     Ears:     Comments: Dried blood in left ear and along TM, unable to visualize TM completely. Pt appears in some discomfort with manipulation of ear on exam    Nose: No rhinorrhea.  Cardiovascular:     Rate and Rhythm: Normal rate and regular rhythm.     Heart sounds: Normal heart sounds.  Pulmonary:     Effort: Pulmonary effort is normal.     Breath sounds: Normal breath sounds.  Neurological:     Mental Status: He is alert. Mental status is at baseline.     Gait: Gait abnormal.       Assessment/Plan: 1. Bleeding from left ear Will send urgent ENT referral. Advised to call if new or worsening symptoms  arise - Ambulatory referral to ENT   General Counseling: Ellis verbalizes understanding of the findings of todays visit and agrees with plan of treatment. I have discussed any further diagnostic evaluation that may be needed or ordered today. We also reviewed his medications today. he has been encouraged to call the office with any questions or concerns that should arise related to todays visit.    Counseling:    Orders Placed This Encounter  Procedures   Ambulatory referral to ENT    No orders of the defined types were placed in this encounter.   Time spent:30 Minutes

## 2023-03-06 NOTE — Telephone Encounter (Signed)
Ok to send

## 2023-03-07 ENCOUNTER — Telehealth: Payer: Self-pay | Admitting: Physician Assistant

## 2023-03-07 NOTE — Telephone Encounter (Signed)
Otolaryngology appointment 03/13/2023 @ [facility]-Toni

## 2023-03-07 NOTE — Telephone Encounter (Signed)
Urgent Otolaryngology referral sent via [facility] to [facility]. Notified son. Gave son telephone # [phone]

## 2023-03-08 ENCOUNTER — Other Ambulatory Visit: Payer: Self-pay | Admitting: Nurse Practitioner

## 2023-03-08 DIAGNOSIS — N400 Enlarged prostate without lower urinary tract symptoms: Secondary | ICD-10-CM

## 2023-03-08 DIAGNOSIS — F01C11 Vascular dementia, severe, with agitation: Secondary | ICD-10-CM

## 2023-03-13 DIAGNOSIS — S00412A Abrasion of left ear, initial encounter: Secondary | ICD-10-CM | POA: Diagnosis not present

## 2023-03-15 ENCOUNTER — Other Ambulatory Visit: Payer: Self-pay | Admitting: Nurse Practitioner

## 2023-03-15 DIAGNOSIS — F01C11 Vascular dementia, severe, with agitation: Secondary | ICD-10-CM

## 2023-04-09 ENCOUNTER — Other Ambulatory Visit: Payer: Self-pay | Admitting: Nurse Practitioner

## 2023-04-09 DIAGNOSIS — I10 Essential (primary) hypertension: Secondary | ICD-10-CM

## 2023-04-14 ENCOUNTER — Other Ambulatory Visit: Payer: Self-pay

## 2023-04-14 DIAGNOSIS — R5381 Other malaise: Secondary | ICD-10-CM

## 2023-04-14 DIAGNOSIS — R630 Anorexia: Secondary | ICD-10-CM

## 2023-04-14 MED ORDER — MIRTAZAPINE 15 MG PO TABS: 15.0000 mg | ORAL_TABLET | Freq: Every day | ORAL | 1 refills | Status: DC

## 2023-05-01 ENCOUNTER — Other Ambulatory Visit: Payer: Self-pay | Admitting: Nurse Practitioner

## 2023-05-01 DIAGNOSIS — R630 Anorexia: Secondary | ICD-10-CM

## 2023-05-01 DIAGNOSIS — R5381 Other malaise: Secondary | ICD-10-CM

## 2023-05-25 DIAGNOSIS — R918 Other nonspecific abnormal finding of lung field: Secondary | ICD-10-CM | POA: Diagnosis not present

## 2023-05-25 DIAGNOSIS — E7800 Pure hypercholesterolemia, unspecified: Secondary | ICD-10-CM | POA: Diagnosis not present

## 2023-05-25 DIAGNOSIS — F0390 Unspecified dementia without behavioral disturbance: Secondary | ICD-10-CM | POA: Diagnosis not present

## 2023-05-25 DIAGNOSIS — E870 Hyperosmolality and hypernatremia: Secondary | ICD-10-CM | POA: Diagnosis not present

## 2023-05-25 DIAGNOSIS — E43 Unspecified severe protein-calorie malnutrition: Secondary | ICD-10-CM | POA: Diagnosis not present

## 2023-05-25 DIAGNOSIS — R1310 Dysphagia, unspecified: Secondary | ICD-10-CM | POA: Diagnosis not present

## 2023-05-25 DIAGNOSIS — R52 Pain, unspecified: Secondary | ICD-10-CM | POA: Diagnosis not present

## 2023-05-25 DIAGNOSIS — E860 Dehydration: Secondary | ICD-10-CM | POA: Diagnosis not present

## 2023-05-25 DIAGNOSIS — J690 Pneumonitis due to inhalation of food and vomit: Secondary | ICD-10-CM | POA: Diagnosis not present

## 2023-05-25 DIAGNOSIS — R64 Cachexia: Secondary | ICD-10-CM | POA: Diagnosis not present

## 2023-05-25 DIAGNOSIS — I10 Essential (primary) hypertension: Secondary | ICD-10-CM | POA: Diagnosis not present

## 2023-05-25 DIAGNOSIS — R0989 Other specified symptoms and signs involving the circulatory and respiratory systems: Secondary | ICD-10-CM | POA: Diagnosis not present

## 2023-05-25 DIAGNOSIS — J189 Pneumonia, unspecified organism: Secondary | ICD-10-CM | POA: Diagnosis not present

## 2023-05-25 DIAGNOSIS — F01C0 Vascular dementia, severe, without behavioral disturbance, psychotic disturbance, mood disturbance, and anxiety: Secondary | ICD-10-CM | POA: Diagnosis not present

## 2023-05-25 DIAGNOSIS — I69354 Hemiplegia and hemiparesis following cerebral infarction affecting left non-dominant side: Secondary | ICD-10-CM | POA: Diagnosis not present

## 2023-05-25 DIAGNOSIS — Z7189 Other specified counseling: Secondary | ICD-10-CM | POA: Diagnosis not present

## 2023-05-25 DIAGNOSIS — R4182 Altered mental status, unspecified: Secondary | ICD-10-CM | POA: Diagnosis not present

## 2023-05-25 DIAGNOSIS — Z515 Encounter for palliative care: Secondary | ICD-10-CM | POA: Diagnosis not present

## 2023-05-25 DIAGNOSIS — F0150 Vascular dementia without behavioral disturbance: Secondary | ICD-10-CM | POA: Diagnosis not present

## 2023-05-25 DIAGNOSIS — R638 Other symptoms and signs concerning food and fluid intake: Secondary | ICD-10-CM | POA: Diagnosis not present

## 2023-05-26 DIAGNOSIS — R52 Pain, unspecified: Secondary | ICD-10-CM | POA: Diagnosis not present

## 2023-05-26 DIAGNOSIS — Z515 Encounter for palliative care: Secondary | ICD-10-CM | POA: Diagnosis not present

## 2023-05-28 ENCOUNTER — Telehealth: Payer: Self-pay

## 2023-05-28 NOTE — Telephone Encounter (Signed)
 Patient son called to let Alyssa know that patient is in [facility] with pneumonia and his dementia has gone to the a different level, so patient will leave on hospice. Son is asking for Fmla paper work to be filled out as patient wants to go back home to [place] to pass.

## 2023-06-17 ENCOUNTER — Telehealth: Payer: Self-pay | Admitting: Nurse Practitioner

## 2023-06-17 NOTE — Telephone Encounter (Signed)
FMLA completed form faxed to [employer]; [phone]. Scanned. Notified Almadelia-Toni

## 2023-06-17 NOTE — Telephone Encounter (Signed)
FMLA for [employer] completed. Faxed back; [phone]. Scanned. Notified son-Toni

## 2023-06-21 ENCOUNTER — Other Ambulatory Visit: Payer: Self-pay | Admitting: Nurse Practitioner

## 2023-06-23 NOTE — Telephone Encounter (Signed)
Please review ok to send

## 2023-07-01 ENCOUNTER — Ambulatory Visit: Payer: Medicare HMO | Admitting: Nurse Practitioner

## 2023-07-03 ENCOUNTER — Ambulatory Visit (INDEPENDENT_AMBULATORY_CARE_PROVIDER_SITE_OTHER): Payer: Medicare HMO | Admitting: Nurse Practitioner

## 2023-07-03 ENCOUNTER — Ambulatory Visit (INDEPENDENT_AMBULATORY_CARE_PROVIDER_SITE_OTHER): Payer: Medicare HMO

## 2023-07-05 ENCOUNTER — Other Ambulatory Visit: Payer: Self-pay | Admitting: Nurse Practitioner

## 2023-07-05 DIAGNOSIS — F01C11 Vascular dementia, severe, with agitation: Secondary | ICD-10-CM

## 2023-07-07 NOTE — Telephone Encounter (Signed)
 Please review and send

## 2023-07-10 ENCOUNTER — Other Ambulatory Visit: Payer: Self-pay | Admitting: Nurse Practitioner

## 2023-07-10 DIAGNOSIS — F01C11 Vascular dementia, severe, with agitation: Secondary | ICD-10-CM

## 2023-07-10 NOTE — Telephone Encounter (Signed)
 Ok to send

## 2023-07-14 ENCOUNTER — Telehealth: Payer: Self-pay | Admitting: Nurse Practitioner

## 2023-07-14 NOTE — Telephone Encounter (Signed)
 Patient's son came into office this morning stating sister FMLA, Ria Bush, needs letter from Alyssa extending her leave with patient. Gave to [employer]

## 2023-07-17 ENCOUNTER — Telehealth: Payer: Self-pay | Admitting: Nurse Practitioner

## 2023-07-17 NOTE — Telephone Encounter (Addendum)
 Extended leave of absence letter faxed to [employer] for patient's daughter; [phone]. Notified patient's son that letter ready to be p/u @ front desk. Scanned-Carlos Fischer

## 2023-07-19 ENCOUNTER — Other Ambulatory Visit: Payer: Self-pay | Admitting: Nurse Practitioner

## 2023-07-19 ENCOUNTER — Other Ambulatory Visit: Payer: Self-pay | Admitting: Internal Medicine

## 2023-07-19 DIAGNOSIS — R630 Anorexia: Secondary | ICD-10-CM

## 2023-07-19 DIAGNOSIS — R5381 Other malaise: Secondary | ICD-10-CM

## 2023-07-23 ENCOUNTER — Ambulatory Visit: Payer: Medicare HMO | Admitting: Nurse Practitioner

## 2023-08-09 ENCOUNTER — Other Ambulatory Visit: Payer: Self-pay | Admitting: Nurse Practitioner

## 2023-08-09 DIAGNOSIS — F01C11 Vascular dementia, severe, with agitation: Secondary | ICD-10-CM

## 2023-08-11 NOTE — Telephone Encounter (Signed)
 Please review ok to sent

## 2023-09-16 ENCOUNTER — Telehealth: Payer: Self-pay

## 2023-09-16 ENCOUNTER — Other Ambulatory Visit: Payer: Self-pay | Admitting: Nurse Practitioner

## 2023-09-16 DIAGNOSIS — F01C11 Vascular dementia, severe, with agitation: Secondary | ICD-10-CM

## 2023-09-16 NOTE — Telephone Encounter (Signed)
Lmom to call us  

## 2023-09-16 NOTE — Telephone Encounter (Signed)
 Lmom to call us back

## 2023-09-17 ENCOUNTER — Telehealth: Payer: Self-pay

## 2023-09-17 NOTE — Telephone Encounter (Signed)
 Spoke with son that he is coming back for his appt in June and still getting med from here advised him that he need us  to do medication pt need appt

## 2023-10-07 ENCOUNTER — Ambulatory Visit: Payer: Medicare HMO | Admitting: Nurse Practitioner

## 2023-10-15 ENCOUNTER — Ambulatory Visit (INDEPENDENT_AMBULATORY_CARE_PROVIDER_SITE_OTHER): Payer: Medicare HMO | Admitting: Nurse Practitioner

## 2023-10-15 ENCOUNTER — Encounter (INDEPENDENT_AMBULATORY_CARE_PROVIDER_SITE_OTHER): Payer: Medicare HMO

## 2023-11-06 ENCOUNTER — Ambulatory Visit: Admitting: Nurse Practitioner

## 2023-12-15 ENCOUNTER — Telehealth: Payer: Self-pay

## 2023-12-15 ENCOUNTER — Other Ambulatory Visit: Payer: Self-pay | Admitting: Nurse Practitioner

## 2023-12-15 DIAGNOSIS — R630 Anorexia: Secondary | ICD-10-CM

## 2023-12-15 DIAGNOSIS — R5381 Other malaise: Secondary | ICD-10-CM

## 2023-12-15 NOTE — Telephone Encounter (Signed)
 Spoke with pt son he is no longer our pt due to pt moved to [place]

## 2023-12-28 ENCOUNTER — Other Ambulatory Visit: Payer: Self-pay | Admitting: Nurse Practitioner

## 2023-12-28 DIAGNOSIS — N400 Enlarged prostate without lower urinary tract symptoms: Secondary | ICD-10-CM

## 2023-12-28 DIAGNOSIS — F01C11 Vascular dementia, severe, with agitation: Secondary | ICD-10-CM

## 2024-01-29 ENCOUNTER — Other Ambulatory Visit: Payer: Self-pay | Admitting: Nurse Practitioner

## 2024-01-29 DIAGNOSIS — I10 Essential (primary) hypertension: Secondary | ICD-10-CM

## 2024-03-20 DEATH — deceased
